# Patient Record
Sex: Female | Born: 1990 | Race: Black or African American | Hispanic: No | Marital: Single | Smoking: Never smoker
Health system: Southern US, Community
[De-identification: ages and names within clinical notes are randomized; demographics above are authoritative.]

## PROBLEM LIST (undated history)

## (undated) DIAGNOSIS — A159 Respiratory tuberculosis unspecified: Secondary | ICD-10-CM

## (undated) DIAGNOSIS — Z87440 Personal history of urinary (tract) infections: Secondary | ICD-10-CM

## (undated) DIAGNOSIS — IMO0002 Reserved for concepts with insufficient information to code with codable children: Secondary | ICD-10-CM

## (undated) DIAGNOSIS — Z789 Other specified health status: Secondary | ICD-10-CM

## (undated) HISTORY — PX: THERAPEUTIC ABORTION: SHX798

## (undated) HISTORY — DX: Respiratory tuberculosis unspecified: A15.9

## (undated) HISTORY — DX: Personal history of urinary (tract) infections: Z87.440

## (undated) HISTORY — DX: Reserved for concepts with insufficient information to code with codable children: IMO0002

---
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
## 2011-05-08 ENCOUNTER — Encounter (HOSPITAL_COMMUNITY): Payer: Self-pay

## 2011-05-08 ENCOUNTER — Inpatient Hospital Stay (HOSPITAL_COMMUNITY)
Admission: AD | Admit: 2011-05-08 | Discharge: 2011-05-11 | DRG: 775 | Disposition: A | Discharge status: Home / Self Care | Payer: Medicaid Other | Source: Ambulatory Visit | Attending: Obstetrics and Gynecology | Admitting: Obstetrics and Gynecology

## 2011-05-08 ENCOUNTER — Inpatient Hospital Stay (HOSPITAL_COMMUNITY): Payer: Medicaid Other

## 2011-05-08 DIAGNOSIS — Z22330 Carrier of Group B streptococcus: Secondary | ICD-10-CM

## 2011-05-08 DIAGNOSIS — O34219 Maternal care for unspecified type scar from previous cesarean delivery: Secondary | ICD-10-CM | POA: Diagnosis present

## 2011-05-08 DIAGNOSIS — Z331 Pregnant state, incidental: Secondary | ICD-10-CM | POA: Diagnosis not present

## 2011-05-08 DIAGNOSIS — IMO0002 Reserved for concepts with insufficient information to code with codable children: Secondary | ICD-10-CM | POA: Diagnosis present

## 2011-05-08 DIAGNOSIS — O0930 Supervision of pregnancy with insufficient antenatal care, unspecified trimester: Secondary | ICD-10-CM

## 2011-05-08 DIAGNOSIS — O4290 Premature rupture of membranes, unspecified as to length of time between rupture and onset of labor, unspecified weeks of gestation: Secondary | ICD-10-CM | POA: Diagnosis present

## 2011-05-08 DIAGNOSIS — O99892 Other specified diseases and conditions complicating childbirth: Secondary | ICD-10-CM | POA: Diagnosis present

## 2011-05-08 HISTORY — DX: Other specified health status: Z78.9

## 2011-05-08 LAB — CBC
Hemoglobin: 10.7 g/dL — ABNORMAL LOW (ref 12.0–15.0)
Platelets: 163 10*3/uL (ref 150–400)
RBC: 3.65 MIL/uL — ABNORMAL LOW (ref 3.87–5.11)
WBC: 9 10*3/uL (ref 4.0–10.5)

## 2011-05-08 LAB — RPR: RPR Ser Ql: NONREACTIVE

## 2011-05-08 MED ORDER — LACTATED RINGERS IV SOLN: 500.0000 mL | INTRAVENOUS | Status: DC | PRN

## 2011-05-08 MED ORDER — PHENYLEPHRINE 40 MCG/ML (10ML) SYRINGE FOR IV PUSH (FOR BLOOD PRESSURE SUPPORT)
80.0000 ug | PREFILLED_SYRINGE | INTRAVENOUS | Status: DC | PRN
  Filled 2011-05-08: qty 5

## 2011-05-08 MED ORDER — PHENYLEPHRINE 40 MCG/ML (10ML) SYRINGE FOR IV PUSH (FOR BLOOD PRESSURE SUPPORT)
80.0000 ug | PREFILLED_SYRINGE | INTRAVENOUS | Status: DC | PRN
  Filled 2011-05-08 (×3): qty 5

## 2011-05-08 MED ORDER — EPHEDRINE 5 MG/ML INJ
10.0000 mg | INTRAVENOUS | Status: DC | PRN
  Filled 2011-05-08 (×3): qty 4

## 2011-05-08 MED ORDER — IBUPROFEN 600 MG PO TABS
600.0000 mg | ORAL_TABLET | Freq: Four times a day (QID) | ORAL | Status: DC | PRN
  Administered 2011-05-09: 600 mg via ORAL
  Filled 2011-05-08: qty 1

## 2011-05-08 MED ORDER — LIDOCAINE HCL (PF) 1 % IJ SOLN
30.0000 mL | INTRAMUSCULAR | Status: DC | PRN
  Filled 2011-05-08 (×2): qty 30

## 2011-05-08 MED ORDER — FLEET ENEMA 7-19 GM/118ML RE ENEM: 1.0000 | ENEMA | RECTAL | Status: DC | PRN

## 2011-05-08 MED ORDER — ACETAMINOPHEN 325 MG PO TABS: 650.0000 mg | ORAL_TABLET | ORAL | Status: DC | PRN

## 2011-05-08 MED ORDER — LACTATED RINGERS IV SOLN
500.0000 mL | Freq: Once | INTRAVENOUS | Status: AC
  Administered 2011-05-09: 1000 mL via INTRAVENOUS

## 2011-05-08 MED ORDER — TERBUTALINE SULFATE 1 MG/ML IJ SOLN: 0.2500 mg | Freq: Once | INTRAMUSCULAR | Status: AC | PRN

## 2011-05-08 MED ORDER — ZOLPIDEM TARTRATE 10 MG PO TABS
10.0000 mg | ORAL_TABLET | Freq: Every evening | ORAL | Status: DC | PRN
  Administered 2011-05-09: 10 mg via ORAL
  Filled 2011-05-08: qty 1

## 2011-05-08 MED ORDER — OXYCODONE-ACETAMINOPHEN 5-325 MG PO TABS
2.0000 | ORAL_TABLET | ORAL | Status: DC | PRN
  Filled 2011-05-08: qty 1

## 2011-05-08 MED ORDER — FENTANYL CITRATE 0.05 MG/ML IJ SOLN: 100.0000 ug | INTRAMUSCULAR | Status: DC | PRN

## 2011-05-08 MED ORDER — FENTANYL 2.5 MCG/ML BUPIVACAINE 1/10 % EPIDURAL INFUSION (WH - ANES)
14.0000 mL/h | INTRAMUSCULAR | Status: DC
  Filled 2011-05-08 (×2): qty 60

## 2011-05-08 MED ORDER — OXYTOCIN 20 UNITS IN LACTATED RINGERS INFUSION - SIMPLE: 125.0000 mL/h | INTRAVENOUS | Status: AC

## 2011-05-08 MED ORDER — ZOLPIDEM TARTRATE 10 MG PO TABS: 10.0000 mg | ORAL_TABLET | Freq: Every evening | ORAL | Status: DC | PRN

## 2011-05-08 MED ORDER — OXYTOCIN 20 UNITS IN LACTATED RINGERS INFUSION - SIMPLE
1.0000 m[IU]/min | INTRAVENOUS | Status: DC
  Administered 2011-05-08: 4 m[IU]/min via INTRAVENOUS
  Administered 2011-05-08: 1 m[IU]/min via INTRAVENOUS

## 2011-05-08 MED ORDER — PENICILLIN G POTASSIUM 5000000 UNITS IJ SOLR
2.5000 10*6.[IU] | INTRAVENOUS | Status: DC
  Administered 2011-05-08 – 2011-05-09 (×2): 2.5 10*6.[IU] via INTRAVENOUS
  Filled 2011-05-08 (×5): qty 2.5

## 2011-05-08 MED ORDER — NALBUPHINE SYRINGE 5 MG/0.5 ML
5.0000 mg | INJECTION | INTRAMUSCULAR | Status: DC | PRN
  Filled 2011-05-08 (×2): qty 0.5

## 2011-05-08 MED ORDER — OXYTOCIN BOLUS FROM INFUSION
500.0000 mL | Freq: Once | INTRAVENOUS | Status: DC
  Administered 2011-05-09: 500 mL via INTRAVENOUS
  Filled 2011-05-08: qty 500
  Filled 2011-05-08: qty 1000

## 2011-05-08 MED ORDER — EPHEDRINE 5 MG/ML INJ
10.0000 mg | INTRAVENOUS | Status: DC | PRN
Start: 2011-05-08 — End: 2011-05-09
  Filled 2011-05-08: qty 4

## 2011-05-08 MED ORDER — DEXTROSE 5 % IV SOLN
5.0000 10*6.[IU] | Freq: Once | INTRAVENOUS | Status: DC
  Administered 2011-05-08: 5 10*6.[IU] via INTRAVENOUS
  Filled 2011-05-08: qty 5

## 2011-05-08 MED ORDER — CITRIC ACID-SODIUM CITRATE 334-500 MG/5ML PO SOLN: 30.0000 mL | ORAL | Status: DC | PRN

## 2011-05-08 MED ORDER — DIPHENHYDRAMINE HCL 50 MG/ML IJ SOLN: 12.5000 mg | INTRAMUSCULAR | Status: DC | PRN

## 2011-05-08 MED ORDER — ONDANSETRON HCL 4 MG/2ML IJ SOLN: 4.0000 mg | Freq: Four times a day (QID) | INTRAMUSCULAR | Status: DC | PRN

## 2011-05-08 MED ORDER — LACTATED RINGERS IV SOLN
INTRAVENOUS | Status: DC
  Administered 2011-05-08 (×2): via INTRAVENOUS

## 2011-05-08 NOTE — Plan of Care (Signed)
Problem: Consults Goal: Birthing Suites Patient Information Press F2 to bring up selections list   Pt > [redacted] weeks EGA     

## 2011-05-08 NOTE — Plan of Care (Signed)
Problem: Consults Goal: Birthing Suites Patient Information Press F2 to bring up selections list Outcome: Completed/Met Date Met:  05/08/11  Pt > [redacted] weeks EGA     

## 2011-05-08 NOTE — Progress Notes (Signed)
Pt sent to MAU for NST and ultrasound. Pt had a NR NST 8-14 in the office and was told to come to MAU but did not. Patients last visit before 8-14 was in April. Pt is a previous cesarean section who desires a TOLAC.

## 2011-05-08 NOTE — Progress Notes (Signed)
Pt sent over for BPP and NST, was seen in office yesterday for  First time since April, had a NR NST and told to come over, didn't come.  Denies any bleeding or leaking of fluid.  + FM.
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
## 2011-05-08 NOTE — Progress Notes (Signed)
05/08/11 2050  Provider Notification  Provider Name/Title Steelman, Steele Sizer, CNM  Method of Notification Phone  Provider called to check up on pt. Notified about FHR, UC and pitocin level. Provider stated to leave pitocin at 32mu/hr.
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
## 2011-05-08 NOTE — Progress Notes (Signed)
Subjective: Pt just up to br.  No c/o's.  Discussed with patient earlier this evening that amnisure positive and has noted intermittent water leakage since yesterday, but unsure exact time first noticed; afi nml on u/s today.  3 males and 1 female visitor at bs currently.   Objective: BP 113/62  Pulse 75  Temp(Src) 98.1 F (36.7 C) (Oral)  Resp 18  Ht 4\' 9"  (1.448 m)  Wt 54.795 kg (120 lb 12.8 oz)  BMI 26.14 kg/m2  SpO2 98%      FHT:  FHR: 135 bpm, variability: moderate,  accelerations:  Abscent,  decelerations:  Present occ'l early, occ'l variable with late component; 10x10 accels, but not 15x15 UC:   irregular, every 2-6 minutes SVE:   Dilation: 2.5 Effacement (%): 60 Station: -2 Exam by:: Manfred Arch, CNM  Labs: Lab Results  Component Value Date   WBC 9.0 05/08/2011   HGB 10.7* 05/08/2011   HCT 32.0* 05/08/2011   MCV 87.7 05/08/2011   PLT 163 05/08/2011    Assessment / Plan: Induction of labor due to PROM,  progressing well on pitocin No prenatal care since April until yesterday.  Labor: Ripening tonight on maintenance of 25mu/min of Pitocin (per c/w dr. Normand Sloop) Preeclampsia:  n/a Fetal Wellbeing:  reassuring, but nonreactive Pain Control:  Labor support without medications I/D:  n/a Anticipated MOD:  TOLAC Recommended ambien to assist with sleep tonight; encouraged patient to try & rest; 4 visitors at bs currently. C/w MD prn  Sydney Azure H 05/08/2011, 11:50 PM

## 2011-05-09 ENCOUNTER — Encounter (HOSPITAL_COMMUNITY): Payer: Self-pay | Admitting: Anesthesiology

## 2011-05-09 ENCOUNTER — Inpatient Hospital Stay (HOSPITAL_COMMUNITY): Payer: Medicaid Other | Admitting: Anesthesiology

## 2011-05-09 ENCOUNTER — Encounter (HOSPITAL_COMMUNITY): Payer: Self-pay | Admitting: *Deleted

## 2011-05-09 ENCOUNTER — Other Ambulatory Visit: Payer: Self-pay

## 2011-05-09 DIAGNOSIS — Z331 Pregnant state, incidental: Secondary | ICD-10-CM | POA: Diagnosis not present

## 2011-05-09 DIAGNOSIS — IMO0002 Reserved for concepts with insufficient information to code with codable children: Secondary | ICD-10-CM | POA: Diagnosis present

## 2011-05-09 DIAGNOSIS — O4290 Premature rupture of membranes, unspecified as to length of time between rupture and onset of labor, unspecified weeks of gestation: Secondary | ICD-10-CM | POA: Diagnosis present

## 2011-05-09 MED ORDER — OXYCODONE-ACETAMINOPHEN 5-325 MG PO TABS
1.0000 | ORAL_TABLET | ORAL | Status: DC | PRN
  Administered 2011-05-09 – 2011-05-11 (×4): 1 via ORAL
  Administered 2011-05-11 (×3): 2 via ORAL
  Filled 2011-05-09: qty 2
  Filled 2011-05-09 (×2): qty 1
  Filled 2011-05-09 (×2): qty 2
  Filled 2011-05-09: qty 1

## 2011-05-09 MED ORDER — DIBUCAINE 1 % RE OINT: 1.0000 "application " | TOPICAL_OINTMENT | RECTAL | Status: DC | PRN

## 2011-05-09 MED ORDER — LANOLIN HYDROUS EX OINT: TOPICAL_OINTMENT | CUTANEOUS | Status: DC | PRN

## 2011-05-09 MED ORDER — IBUPROFEN 600 MG PO TABS
600.0000 mg | ORAL_TABLET | Freq: Four times a day (QID) | ORAL | Status: DC
  Administered 2011-05-09 – 2011-05-11 (×9): 600 mg via ORAL
  Filled 2011-05-09 (×9): qty 1

## 2011-05-09 MED ORDER — ZOLPIDEM TARTRATE 5 MG PO TABS: 5.0000 mg | ORAL_TABLET | Freq: Every evening | ORAL | Status: DC | PRN

## 2011-05-09 MED ORDER — BENZOCAINE-MENTHOL 20-0.5 % EX AERO
1.0000 "application " | INHALATION_SPRAY | CUTANEOUS | Status: DC | PRN
  Administered 2011-05-09: 1 via TOPICAL

## 2011-05-09 MED ORDER — MAGNESIUM HYDROXIDE 400 MG/5ML PO SUSP: 30.0000 mL | ORAL | Status: DC | PRN

## 2011-05-09 MED ORDER — MEASLES, MUMPS & RUBELLA VAC ~~LOC~~ INJ
0.5000 mL | INJECTION | Freq: Once | SUBCUTANEOUS | Status: DC
  Filled 2011-05-09: qty 0.5

## 2011-05-09 MED ORDER — SENNOSIDES-DOCUSATE SODIUM 8.6-50 MG PO TABS
2.0000 | ORAL_TABLET | Freq: Every day | ORAL | Status: DC
  Administered 2011-05-09 – 2011-05-10 (×2): 2 via ORAL

## 2011-05-09 MED ORDER — DIPHENHYDRAMINE HCL 25 MG PO CAPS: 25.0000 mg | ORAL_CAPSULE | Freq: Four times a day (QID) | ORAL | Status: DC | PRN

## 2011-05-09 MED ORDER — WITCH HAZEL-GLYCERIN EX PADS
1.0000 "application " | MEDICATED_PAD | CUTANEOUS | Status: DC | PRN
  Administered 2011-05-09: 1 via TOPICAL

## 2011-05-09 MED ORDER — SIMETHICONE 80 MG PO CHEW: 80.0000 mg | CHEWABLE_TABLET | ORAL | Status: DC | PRN

## 2011-05-09 MED ORDER — ONDANSETRON HCL 4 MG/2ML IJ SOLN: 4.0000 mg | INTRAMUSCULAR | Status: DC | PRN

## 2011-05-09 MED ORDER — BENZOCAINE-MENTHOL 20-0.5 % EX AERO
INHALATION_SPRAY | CUTANEOUS | Status: AC
  Administered 2011-05-09: 1 via TOPICAL
  Filled 2011-05-09: qty 56

## 2011-05-09 MED ORDER — ONDANSETRON HCL 4 MG PO TABS: 4.0000 mg | ORAL_TABLET | ORAL | Status: DC | PRN

## 2011-05-09 MED ORDER — MEDROXYPROGESTERONE ACETATE 150 MG/ML IM SUSP: 150.0000 mg | INTRAMUSCULAR | Status: DC | PRN

## 2011-05-09 MED ORDER — TETANUS-DIPHTH-ACELL PERTUSSIS 5-2.5-18.5 LF-MCG/0.5 IM SUSP
0.5000 mL | Freq: Once | INTRAMUSCULAR | Status: AC
  Administered 2011-05-10: 0.5 mL via INTRAMUSCULAR
  Filled 2011-05-09: qty 0.5

## 2011-05-09 MED ORDER — PRENATAL PLUS 27-1 MG PO TABS
1.0000 | ORAL_TABLET | Freq: Every day | ORAL | Status: DC
  Administered 2011-05-09 – 2011-05-11 (×3): 1 via ORAL
  Filled 2011-05-09 (×3): qty 1

## 2011-05-09 MED ORDER — PRENATAL PLUS 27-1 MG PO TABS: 1.0000 | ORAL_TABLET | Freq: Every day | ORAL | Status: DC

## 2011-05-09 NOTE — Progress Notes (Signed)
Pt called out, thinks she ROM

## 2011-05-09 NOTE — Anesthesia Preprocedure Evaluation (Signed)
Anesthesia Evaluation  Name, MR# and DOB Patient awake  General Assessment Comment  Reviewed: Allergy & Precautions, H&P , Patient's Chart, lab work & pertinent test results  Airway Mallampati: II TM Distance: >3 FB Neck ROM: full    Dental  (+) Teeth Intact   Pulmonary  clear to auscultation  breath sounds clear to auscultation none    Cardiovascular regular Normal    Neuro/Psych   GI/Hepatic/Renal   Endo/Other    Abdominal   Musculoskeletal   Hematology   Peds  Reproductive/Obstetrics (+) Pregnancy    Anesthesia Other Findings VBAC            Anesthesia Physical Anesthesia Plan  ASA: II  Anesthesia Plan: Epidural   Post-op Pain Management:    Induction:   Airway Management Planned:   Additional Equipment:   Intra-op Plan:   Post-operative Plan:   Informed Consent: I have reviewed the patients History and Physical, chart, labs and discussed the procedure including the risks, benefits and alternatives for the proposed anesthesia with the patient or authorized representative who has indicated his/her understanding and acceptance.   Dental Advisory Given  Plan Discussed with: CRNA and Surgeon  Anesthesia Plan Comments: (Labs checked- platelets confirmed with RN in room. Fetal heart tracing, per RN, reportedly stable enough for sitting procedure. Discussed epidural, and patient consents to the procedure:  included risk of possible headache,backache, failed block, allergic reaction, and nerve injury. This patient was asked if she had any questions or concerns before the procedure started. )        Anesthesia Quick Evaluation

## 2011-05-09 NOTE — Progress Notes (Signed)
Subjective: Pt requesting pain meds & c/o vag pressure.  RN checked pt's cx and 7cm.  Pt very droggy s/p Ambien and FHR with minimal variability, so rec'd epidural over IV pain med.  Pt able to sleep in btwn ctxs.  Female visitor remains at bs at present.  Objective: BP 111/77  Pulse 79  Temp(Src) 97.7 F (36.5 C) (Oral)  Resp 18  Ht 4\' 9"  (1.448 m)  Wt 54.795 kg (120 lb 12.8 oz)  BMI 26.14 kg/m2  SpO2 98%      FHT:  FHR: 130 bpm, variability: minimal ,  accelerations:  Abscent,  decelerations:  Absent UC:   regular, every 2 minutes on 4mu Pitocin SVE:   Dilation: 7.5 Effacement (%): 80 Station: -2 Exam by:: Ansah-mensah, rnc RN reports BBOW Labs: Lab Results  Component Value Date   WBC 9.0 05/08/2011   HGB 10.7* 05/08/2011   HCT 32.0* 05/08/2011   MCV 87.7 05/08/2011   PLT 163 05/08/2011    Assessment / Plan: Laboring off 4mu Pitocin without titration  Labor: Progressing on Pitocin, will continue to increase then AROM Preeclampsia:  n/a Fetal Wellbeing:  Category I Pain Control:  Will proceed with epidural I/D:  n/a Anticipated MOD:  NSVD  [place] Basham H 05/09/2011, 3:03 AM

## 2011-05-09 NOTE — Progress Notes (Signed)
05/09/11 0240  Provider Notification  Provider Name/Title Antonietta Breach, CNM  Method of Notification Phone  Notified about FHR, UC, pt 's request for pain medication

## 2011-05-09 NOTE — Progress Notes (Signed)
Dr. Jean Rosenthal in patient's room around 0315, and because patient drowsy from Ambien, he would not put in epidural.  Just talked with him again and explained that patient immature, from [place], and demeanor also part of her personality.  Asked if he would come back and try again now because she is more alert at this time, and he agreed to come back to bedside at 5:15.

## 2011-05-09 NOTE — Progress Notes (Signed)
05/09/11 0314  Epidural  Epidural   ANMD @ BS   Dr. Sherron Ales at bedside to place epidural. Explained risk and benefit to epidural to pt. MD not comfortable placing  Epidural because pt is sleepy and cannot verbalize back risk and benefit of epidural

## 2011-05-09 NOTE — Progress Notes (Signed)
Delivery Note At 5:05 AM a viable female "[name]" was delivered via Vaginal, Spontaneous Delivery (Presentation:LOA ;  ).  APGAR: 6, 9; weight 7 lb 1 oz (3204 g).   Placenta status: Intact, Spontaneous.  Cord: 3 vessels with the following complications: None.    Anesthesia: None  Episiotomy: None Lacerations:bilateral Labial; hemostatic; Left lac deeper than Rt Suture Repair: no repair secondary to hemostatic Est. Blood Loss (mL): 200  Mom to postpartum.  Baby to nursery-stable. Plans to BF & Desires Circumcision (OP secondary to Medicaid) Gabriela Benton H 05/09/2011, 5:47 AM

## 2011-05-09 NOTE — Progress Notes (Signed)
Addendum to previous note: Informed patient of Dr. Edison Pace plan.  Pt very uncomfortable and wanting pain intervention. Discussed with patient I do not feel comfortable giving her Nubain at this time secondary to [facility] and risk of making her more groggy in anticipation of getting epidural and need to be more alert. Ordered RN to decrease Pitocin to 67mu/min from 4mu, to try & space ctxs in meantime for comfort.

## 2011-05-09 NOTE — Progress Notes (Signed)
UR Chart review completed.  

## 2011-05-09 NOTE — Progress Notes (Signed)
05/09/11 0329  Provider Notification  Provider Name/Title Antonietta Breach, CNM  Method of Notification Phone  Informed that pt was not able to get epidural and is requesting for pain medication. Will call and talk to anesthesiologist

## 2011-05-09 NOTE — Progress Notes (Signed)
Called to bs just before 5am because patient complete and involuntarily pushing.  Pt's labor continued to progress on 2mu of Pitocin and was unable to receive epidural, and withheld IV pain medicine secondary to attempting to get epidural.  On arrival fetal station +3 to +4 and starting to crown.

## 2011-05-10 LAB — CBC
HCT: 28.6 % — ABNORMAL LOW (ref 36.0–46.0)
MCHC: 34.6 g/dL (ref 30.0–36.0)
MCV: 86.9 fL (ref 78.0–100.0)
RDW: 16.7 % — ABNORMAL HIGH (ref 11.5–15.5)
WBC: 12 10*3/uL — ABNORMAL HIGH (ref 4.0–10.5)

## 2011-05-10 NOTE — Progress Notes (Signed)
Post Partum Day 1 Subjective: no complaints, up ad lib, voiding and tolerating PO  Objective: Blood pressure 100/62, pulse 67, temperature 98 F (36.7 C), temperature source Oral, resp. rate 18, height 4\' 9"  (1.448 m), weight 54.795 kg (120 lb 12.8 oz), SpO2 100.00%, unknown if currently breastfeeding.  Physical Exam:  General: alert, cooperative and no distress Lochia: appropriate Uterine Fundus: firm Incision: healing well DVT Evaluation: No evidence of DVT seen on physical exam.   Basename 05/10/11 0520 05/08/11 1548  HGB 9.9* 10.7*  HCT 28.6* 32.0*    Assessment/Plan: Plan for discharge tomorrow, Discharge home, Lactation consult, Social Work consult and Contraception Depo-Provera   LOS: 2 days   Anabel Halon 05/10/2011, 9:06 AM

## 2011-05-11 MED ORDER — MEDROXYPROGESTERONE ACETATE 150 MG/ML IM SUSP: 150.0000 mg | Freq: Once | INTRAMUSCULAR | Status: DC

## 2011-05-11 MED ORDER — MEDROXYPROGESTERONE ACETATE 150 MG/ML IM SUSP: 150.0000 mg | INTRAMUSCULAR | Status: DC

## 2011-05-11 NOTE — Progress Notes (Signed)
Post Partum Day 2 Subjective: no complaints, up ad lib, voiding, tolerating PO and Desires D/C home  Objective: Blood pressure 119/83, pulse 69, temperature 98.2 F (36.8 C), temperature source Oral, resp. rate 18, height 4\' 9"  (1.448 m), weight 54.795 kg (120 lb 12.8 oz), SpO2 100.00%, unknown if currently breastfeeding.  Physical Exam:  General: alert, cooperative and no distress Lochia: appropriate Uterine Fundus: firm Incision: n/a DVT Evaluation: No evidence of DVT seen on physical exam.   Basename 05/10/11 0520 05/08/11 1548  HGB 9.9* 10.7*  HCT 28.6* 32.0*    Assessment/Plan: Discharge home, Breastfeeding, Social Work consult and Contraception Depo-provera. Plans Outpatient Circ   LOS: 3 days   Gabriela Benton 05/11/2011, 8:07 AM

## 2011-05-11 NOTE — Discharge Summary (Signed)
   Obstetric Discharge Summary Reason for Admission: induction of labor and for insufficient prenatal care and non-compliance Prenatal Procedures: NST and ultrasound Intrapartum Procedures: spontaneous vaginal delivery/ Successful VBAC Postpartum Procedures: none Complications-Operative and Postpartum: none  Temp:  [97.9 F (36.6 C)-98.2 F (36.8 C)] 98.2 F (36.8 C) (08/18 0535) Pulse Rate:  [66-69] 69  (08/18 0535) Resp:  [16-18] 18  (08/18 0535) BP: (94-119)/(55-83) 119/83 mmHg (08/18 0535) Hemoglobin  Date Value Range Status  05/10/2011 9.9* 12.0-15.0 (g/dL) Final     HCT  Date Value Range Status  05/10/2011 28.6* 36.0-46.0 (%) Final    Hospital Course:  Pt presented to L&D >24 hours after instructions to go for Non-Reactive NST in office. Pt had lapse in care of 17 wks, with inability of office to contact patient and multiple letters sent without response. Pt was seen in office @ 40.[redacted] wks EGA, had Non-Reactive NST. Admitted following day for IOL. Progressed rapidly without epidural and successfully VBAC a LMI, Intact perineum. PP course unremarkable, with social work consult for insufficient [facility], age, pt currently in foster care, and language barrier. On PP day 2 pt. Requests d/c home. Several family members @ BS for support. Plans for DMPA prior to d/c.   Discharge Diagnoses: Term Pregnancy-delivered and Successful VBAC  Discharge Information: Date: 05/11/2011 Activity: pelvic rest Diet: routine Medications: Motrin 600 mg po q 6 hrs prn pain Depo-Provera prior to d/c Medication List  As of 05/11/2011  8:09 AM   START taking these medications         medroxyPROGESTERone 150 MG/ML injection   Commonly known as: DEPO-PROVERA   Inject 1 mL (150 mg total) into the muscle every 3 (three) months.         CONTINUE taking these medications         prenatal vitamin w/FE, FA 27-1 MG Tabs          Where to get your medications    These are the prescriptions that you need to  pick up.   You may get these medications from any pharmacy.         medroxyPROGESTERone 150 MG/ML injection           Condition: stable Instructions: refer to practice specific booklet Discharge to: home Follow-up Information    Follow up with [facility]. Make an appointment in 6 weeks. (or  as needed if symptoms worsen)          Newborn Data: Live born  Information for the patient's newborn:  Jaquayla, Hege [[id number]]  female ; APGAR , ; weight ;  Home with mother.  Anabel Halon 05/11/2011, 8:09 AM

## 2011-09-24 NOTE — L&D Delivery Note (Signed)
Delivery Note  I was called to pt's BS secondary to increase in ctx, pt requesting pain meds. She was 9cm w BBOW and involuntarily pushing, she was quickly tx'd to birthing suites and NICU team called, Dr Pennie Rushing also updated AROM for light stained meconium, just prior to delivery, pt continued pushing well   At 9:38 AM a viable female was delivered via VBAC, Spontaneous (Presentation: Right Occiput Anterior).  Infant was vigorous w spontaneous cry, cord cut and clamped and infant to warmer and NICU team present, APGAR: 9, 9; weight 3 lb 15.1 oz (1788 g).   Placenta status: spontaneous, intact, approx 4cm area of abruption on lateral edge, sent to pathology  Cord: 3 vessels with the following complications: .  Cord pH: n/a  Anesthesia: None  Episiotomy: None Lacerations: None Suture Repair: n/a Est. Blood Loss (mL): 100  Mom to postpartum.  Baby to NICU. Pt plans to BF Dr Pennie Rushing notified   Sanda Klein M 05/02/2012, 10:02 AM

## 2012-01-29 ENCOUNTER — Emergency Department (HOSPITAL_COMMUNITY)
Admission: EM | Admit: 2012-01-29 | Discharge: 2012-01-30 | Disposition: A | Discharge status: Home / Self Care | Payer: Medicaid Other | Attending: Emergency Medicine | Admitting: Emergency Medicine

## 2012-01-29 ENCOUNTER — Encounter (HOSPITAL_COMMUNITY): Payer: Self-pay | Admitting: *Deleted

## 2012-01-29 DIAGNOSIS — O209 Hemorrhage in early pregnancy, unspecified: Secondary | ICD-10-CM | POA: Insufficient documentation

## 2012-01-29 DIAGNOSIS — Z3490 Encounter for supervision of normal pregnancy, unspecified, unspecified trimester: Secondary | ICD-10-CM

## 2012-01-29 NOTE — ED Notes (Signed)
Pt experienced cramping and vaginal bleeding yesterday.  Today bleeding continues, but no cramping.  Bleeding diminished today.  LMP 08/13/11

## 2012-01-30 ENCOUNTER — Emergency Department (HOSPITAL_COMMUNITY): Payer: Medicaid Other

## 2012-01-30 LAB — BASIC METABOLIC PANEL
BUN: 5 mg/dL — ABNORMAL LOW (ref 6–23)
Calcium: 8.8 mg/dL (ref 8.4–10.5)
Creatinine, Ser: 0.63 mg/dL (ref 0.50–1.10)
GFR calc Af Amer: >90 mL/min (ref >90)
GFR calc non Af Amer: >90 mL/min (ref >90)
Potassium: 3.7 mEq/L (ref 3.5–5.1)

## 2012-01-30 LAB — URINALYSIS, ROUTINE W REFLEX MICROSCOPIC
Bilirubin Urine: NEGATIVE
Ketones, ur: NEGATIVE mg/dL
Nitrite: NEGATIVE
Protein, ur: NEGATIVE mg/dL
Urobilinogen, UA: 1 mg/dL (ref 0.0–1.0)

## 2012-01-30 LAB — POCT PREGNANCY, URINE: Preg Test, Ur: POSITIVE — AB

## 2012-01-30 LAB — GC/CHLAMYDIA PROBE AMP, GENITAL: Chlamydia, DNA Probe: NEGATIVE

## 2012-01-30 LAB — CBC
Hemoglobin: 11.3 g/dL — ABNORMAL LOW (ref 12.0–15.0)
MCH: 31.4 pg (ref 26.0–34.0)
MCHC: 36 g/dL (ref 30.0–36.0)

## 2012-01-30 LAB — DIFFERENTIAL
Basophils Relative: 0 % (ref 0–1)
Eosinophils Absolute: 0 10*3/uL (ref 0.0–0.7)
Monocytes Relative: 9 % (ref 3–12)
Neutrophils Relative %: 61 % (ref 43–77)

## 2012-01-30 LAB — URINE MICROSCOPIC-ADD ON

## 2012-01-30 MED ORDER — NITROFURANTOIN MONOHYD MACRO 100 MG PO CAPS: 100.0000 mg | ORAL_CAPSULE | Freq: Two times a day (BID) | ORAL | Status: AC

## 2012-01-30 MED ORDER — PRENATAL COMPLETE 14-0.4 MG PO TABS: 1.0000 | ORAL_TABLET | Freq: Every day | ORAL | Status: DC

## 2012-01-30 NOTE — ED Notes (Signed)
Pt back from CT

## 2012-01-30 NOTE — ED Notes (Signed)
Delay explained to patient -- ultrasound tech is currently en route to [facility]

## 2012-01-30 NOTE — ED Notes (Signed)
Ultrasound technician is off campus at [facility]. Currently waiting for tech to return to [facility] campus to complete ordered ultrasound.

## 2012-01-30 NOTE — ED Provider Notes (Signed)
Medical screening examination/treatment/procedure(s) were conducted as a shared visit with non-physician practitioner(s) and myself.  I personally evaluated the patient during the encounter. Pregnancy with possible vaginal bleeding. No vaginal bleeding on pelvic exam. Ultrasound was done and showed an intrauterine pregnancy. She was instructed to followup with OB.  Juliet Rude. Rubin Payor, MD 01/30/12 0730

## 2012-01-30 NOTE — ED Provider Notes (Signed)
History     CSN: [id number]  Arrival date & time 01/29/12  2254   First MD Initiated Contact with Patient 01/29/12 2331      Chief Complaint  Patient presents with  . Vaginal Bleeding    LMP 08/12/12    (Consider location/radiation/quality/duration/timing/severity/associated sxs/prior treatment) HPI Comments: 21 yo G3P2 female presents today with vaginal bleeding.  Patient was last in labor in August.  She believes she is currently pregnant with her lmp in November 2012.  Reports minimal vaginal bleeding beginning yesterday.  Blood is found on her bathroom tissue, but not in her underwear.  Describes it as bright red blood with no clots.  She is not on any form of birth control and she is sexually active.  Has not confirmed her pregnancy or been seen by an obstetrician to seek prenatal care.  Last birth was a c-section at 40.5 weeks.  Both of her children are healthy with no complications throughout her pregnancies or during labor.  Patient reports fetal movement as recent as last evening, breast tenderness.  Patient reports increased vaginal discharge with a white color.  Reports vaginal itching, fever.  Denies abdominal/pelvic/back pain, genital sores, rash, headaches, dysuria, polyuria, urgency. Nothing makes bleeding better or worse. Bleeding is improving today.   Patient is a 21 y.o. female presenting with vaginal bleeding. The history is provided by the patient.  Vaginal Bleeding This is a new problem. The current episode started yesterday. The problem occurs constantly. The problem has been gradually improving. Pertinent negatives include no abdominal pain, chest pain, chills, coughing, fever, headaches, myalgias, nausea, rash, sore throat or vomiting.    Past Medical History  Diagnosis Date  . No pertinent past medical history     Past Surgical History  Procedure Date  . Cesarean section   . Cesarian 2011    No family history on file.  History  Substance Use Topics  .  Smoking status: Never Smoker   . Smokeless tobacco: Never Used  . Alcohol Use: No    OB History    Grav Para Term Preterm Abortions TAB SAB Ect Mult Living   2 2 2  0 0 0 0 0 0 2      Review of Systems  Constitutional: Negative for fever and chills.  HENT: Negative for sore throat and rhinorrhea.   Eyes: Negative for redness.  Respiratory: Negative for cough and shortness of breath.   Cardiovascular: Negative for chest pain.  Gastrointestinal: Negative for nausea, vomiting, abdominal pain, diarrhea and blood in stool.  Genitourinary: Positive for vaginal bleeding and vaginal discharge. Negative for dysuria, urgency, frequency, hematuria, flank pain, decreased urine volume, genital sores, vaginal pain, pelvic pain and dyspareunia.  Musculoskeletal: Negative for myalgias.  Skin: Negative for rash.  Neurological: Negative for headaches.    Allergies  Review of patient's allergies indicates no known allergies.  Home Medications  No current outpatient prescriptions on file.  BP 114/74  Pulse 85  Temp(Src) 98.5 F (36.9 C) (Oral)  Ht 5' (1.524 m)  Wt 103 lb (46.72 kg)  BMI 20.12 kg/m2  SpO2 100%  Breastfeeding? Unknown  Physical Exam  Nursing note and vitals reviewed. Constitutional: She appears well-developed and well-nourished. No distress.  HENT:  Head: Normocephalic and atraumatic.  Eyes: Conjunctivae are normal. Right eye exhibits no discharge. Left eye exhibits no discharge.  Neck: Normal range of motion. Neck supple.  Cardiovascular: Normal rate, regular rhythm and normal heart sounds.   No murmur heard. Pulmonary/Chest: Effort normal  and breath sounds normal. No respiratory distress. She has no wheezes.  Abdominal: Soft. Bowel sounds are normal. She exhibits no distension. There is no tenderness. There is no rebound and no guarding.  Genitourinary: Vaginal discharge found.       No erythema or sores on the labia.  Cervix closed with no blood noted in the vaginal  vault or at the cervical os.  Thick white discharge noted to be coming from cervical os. Cervix mobile and soft.  Uterus is gravid to 2 cm above umbilicus. Mild tenderness with palpation of right adnexa without mass. No cervical motion tenderness.   Musculoskeletal: She exhibits no edema and no tenderness.  Neurological: She is alert.  Skin: Skin is warm and dry.  Psychiatric: She has a normal mood and affect.    ED Course  Procedures (including critical care time)  Labs Reviewed  CBC - Abnormal; Notable for the following:    RBC 3.60 (*)    Hemoglobin 11.3 (*)    HCT 31.4 (*)    All other components within normal limits  BASIC METABOLIC PANEL - Abnormal; Notable for the following:    Glucose, Bld 67 (*)    BUN 5 (*)    All other components within normal limits  URINALYSIS, ROUTINE W REFLEX MICROSCOPIC - Abnormal; Notable for the following:    APPearance CLOUDY (*)    Leukocytes, UA SMALL (*)    All other components within normal limits  WET PREP, GENITAL - Abnormal; Notable for the following:    WBC, Wet Prep HPF POC TOO NUMEROUS TO COUNT (*)    All other components within normal limits  POCT PREGNANCY, URINE - Abnormal; Notable for the following:    Preg Test, Ur POSITIVE (*)    All other components within normal limits  URINE MICROSCOPIC-ADD ON - Abnormal; Notable for the following:    Squamous Epithelial / LPF MANY (*)    Bacteria, UA MANY (*)    All other components within normal limits  DIFFERENTIAL  ABO/RH  GC/CHLAMYDIA PROBE AMP, GENITAL   No results found.   No diagnosis found.  12:31 AM Patient seen and examined. Work-up initiated. +preg.   Vital signs reviewed and are as follows: Filed Vitals:   01/29/12 2257  BP: 114/74  Pulse: 85  Temp: 98.5 F (36.9 C)   Pelvic exam performed with chaperone. [place] pending.   1:23 AM Handoff to Dr. Rubin Payor who will follow-up on [place] results.   MDM  Pregnant patient, no pre-natal care. [place] pending to eval pregnancy.  Possible cervicitis.         Renne Crigler, [place] 01/30/12 0127

## 2012-01-30 NOTE — ED Notes (Signed)
Gave pt some water. Okayed by Dr. Rubin Payor.

## 2012-04-21 ENCOUNTER — Ambulatory Visit (INDEPENDENT_AMBULATORY_CARE_PROVIDER_SITE_OTHER): Payer: Medicaid Other | Admitting: Obstetrics and Gynecology

## 2012-04-21 ENCOUNTER — Encounter: Payer: Self-pay | Admitting: Obstetrics and Gynecology

## 2012-04-21 VITALS — BP 90/58 | Wt 123.0 lb

## 2012-04-21 DIAGNOSIS — Z331 Pregnant state, incidental: Secondary | ICD-10-CM

## 2012-04-21 DIAGNOSIS — F1910 Other psychoactive substance abuse, uncomplicated: Secondary | ICD-10-CM

## 2012-04-21 DIAGNOSIS — Z9889 Other specified postprocedural states: Secondary | ICD-10-CM

## 2012-04-21 DIAGNOSIS — Z8742 Personal history of other diseases of the female genital tract: Secondary | ICD-10-CM

## 2012-04-21 DIAGNOSIS — Z98891 History of uterine scar from previous surgery: Secondary | ICD-10-CM

## 2012-04-21 DIAGNOSIS — O0930 Supervision of pregnancy with insufficient antenatal care, unspecified trimester: Secondary | ICD-10-CM

## 2012-04-21 LAB — POCT URINALYSIS DIPSTICK
Bilirubin, UA: NEGATIVE
Glucose, UA: NEGATIVE
Ketones, UA: NEGATIVE
Leukocytes, UA: NEGATIVE

## 2012-04-21 MED ORDER — FUSION PLUS PO CAPS: 1.0000 | ORAL_CAPSULE | Freq: Every day | ORAL | Status: DC

## 2012-04-21 NOTE — Progress Notes (Signed)
Pt unsure LMP

## 2012-04-22 LAB — PRENATAL PANEL VII
Basophils Absolute: 0 10*3/uL (ref 0.0–0.1)
HCT: 31.5 % — ABNORMAL LOW (ref 36.0–46.0)
HIV: NONREACTIVE
Hemoglobin: 11.2 g/dL — ABNORMAL LOW (ref 12.0–15.0)
Hepatitis B Surface Ag: NEGATIVE
Lymphocytes Relative: 26 % (ref 12–46)
Monocytes Absolute: 0.4 10*3/uL (ref 0.1–1.0)
Monocytes Relative: 6 % (ref 3–12)
Neutro Abs: 5.2 10*3/uL (ref 1.7–7.7)
Neutrophils Relative %: 68 % (ref 43–77)
Rh Type: POSITIVE
WBC: 7.6 10*3/uL (ref 4.0–10.5)

## 2012-04-22 LAB — GLUCOSE TOLERANCE, 1 HOUR (50G) W/O FASTING: Glucose, 1 Hour GTT: 106 mg/dL (ref 70–140)

## 2012-04-22 LAB — VARICELLA ZOSTER ANTIBODY, IGG: Varicella IgG: 4.04 {ISR} — ABNORMAL HIGH

## 2012-04-24 NOTE — Addendum Note (Signed)
Addended byLavera Guise on: 04/24/2012 04:48 PM   Modules accepted: Level of Service
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
## 2012-04-26 ENCOUNTER — Encounter (HOSPITAL_COMMUNITY): Payer: Self-pay

## 2012-04-26 ENCOUNTER — Inpatient Hospital Stay (HOSPITAL_COMMUNITY)
Admission: AD | Admit: 2012-04-26 | Discharge: 2012-05-04 | DRG: 774 | Disposition: A | Discharge status: Home / Self Care | Payer: Medicaid Other | Source: Ambulatory Visit | Attending: Obstetrics and Gynecology | Admitting: Obstetrics and Gynecology

## 2012-04-26 ENCOUNTER — Inpatient Hospital Stay (HOSPITAL_COMMUNITY): Payer: Medicaid Other

## 2012-04-26 DIAGNOSIS — O0930 Supervision of pregnancy with insufficient antenatal care, unspecified trimester: Secondary | ICD-10-CM

## 2012-04-26 DIAGNOSIS — O9902 Anemia complicating childbirth: Secondary | ICD-10-CM | POA: Diagnosis present

## 2012-04-26 DIAGNOSIS — O4690 Antepartum hemorrhage, unspecified, unspecified trimester: Secondary | ICD-10-CM

## 2012-04-26 DIAGNOSIS — O365990 Maternal care for other known or suspected poor fetal growth, unspecified trimester, not applicable or unspecified: Secondary | ICD-10-CM | POA: Diagnosis present

## 2012-04-26 DIAGNOSIS — D649 Anemia, unspecified: Secondary | ICD-10-CM | POA: Diagnosis present

## 2012-04-26 DIAGNOSIS — O4590 Premature separation of placenta, unspecified, unspecified trimester: Secondary | ICD-10-CM

## 2012-04-26 DIAGNOSIS — O34219 Maternal care for unspecified type scar from previous cesarean delivery: Secondary | ICD-10-CM | POA: Diagnosis not present

## 2012-04-26 DIAGNOSIS — O4100X0 Oligohydramnios, unspecified trimester, not applicable or unspecified: Secondary | ICD-10-CM | POA: Diagnosis present

## 2012-04-26 DIAGNOSIS — O09899 Supervision of other high risk pregnancies, unspecified trimester: Secondary | ICD-10-CM

## 2012-04-26 LAB — CBC
HCT: 24.3 % — ABNORMAL LOW (ref 36.0–46.0)
Hemoglobin: 8.9 g/dL — ABNORMAL LOW (ref 12.0–15.0)
Hemoglobin: 8.7 g/dL — ABNORMAL LOW (ref 12.0–15.0)
MCH: 31.2 pg (ref 26.0–34.0)
MCH: 31 pg (ref 26.0–34.0)
MCHC: 35.8 g/dL (ref 30.0–36.0)
MCV: 86.5 fL (ref 78.0–100.0)
Platelets: 165 10*3/uL (ref 150–400)
Platelets: 146 K/uL — ABNORMAL LOW (ref 150–400)
RBC: 2.85 MIL/uL — ABNORMAL LOW (ref 3.87–5.11)
RBC: 2.81 MIL/uL — ABNORMAL LOW (ref 3.87–5.11)
RDW: 12.7 % (ref 11.5–15.5)
WBC: 8.5 10*3/uL (ref 4.0–10.5)
WBC: 10.2 K/uL (ref 4.0–10.5)

## 2012-04-26 LAB — PREPARE RBC (CROSSMATCH)

## 2012-04-26 LAB — ABO/RH: ABO/RH(D): O POS

## 2012-04-26 MED ORDER — LACTATED RINGERS IV SOLN
INTRAVENOUS | Status: DC
  Administered 2012-04-26 – 2012-04-29 (×9): via INTRAVENOUS
  Administered 2012-04-30: 125 mL/h via INTRAVENOUS
  Administered 2012-04-30: 01:00:00 via INTRAVENOUS

## 2012-04-26 MED ORDER — CALCIUM CARBONATE ANTACID 500 MG PO CHEW: 2.0000 | CHEWABLE_TABLET | ORAL | Status: DC | PRN

## 2012-04-26 MED ORDER — PRENATAL MULTIVITAMIN CH
1.0000 | ORAL_TABLET | Freq: Every day | ORAL | Status: DC
  Administered 2012-04-27 – 2012-05-01 (×5): 1 via ORAL
  Filled 2012-04-26 (×6): qty 1

## 2012-04-26 MED ORDER — BETAMETHASONE SOD PHOS & ACET 6 (3-3) MG/ML IJ SUSP
12.0000 mg | Freq: Once | INTRAMUSCULAR | Status: AC
  Administered 2012-04-26: 12 mg via INTRAMUSCULAR
  Filled 2012-04-26: qty 2

## 2012-04-26 MED ORDER — ZOLPIDEM TARTRATE 5 MG PO TABS: 5.0000 mg | ORAL_TABLET | Freq: Every evening | ORAL | Status: DC | PRN

## 2012-04-26 MED ORDER — NIFEDIPINE 10 MG PO CAPS
10.0000 mg | ORAL_CAPSULE | Freq: Once | ORAL | Status: AC
  Administered 2012-04-26: 10 mg via ORAL
  Filled 2012-04-26: qty 1

## 2012-04-26 MED ORDER — ACETAMINOPHEN 325 MG PO TABS: 650.0000 mg | ORAL_TABLET | ORAL | Status: DC | PRN

## 2012-04-26 MED ORDER — DOCUSATE SODIUM 100 MG PO CAPS
100.0000 mg | ORAL_CAPSULE | Freq: Every day | ORAL | Status: DC
  Administered 2012-04-27 – 2012-04-28 (×2): 100 mg via ORAL
  Filled 2012-04-26 (×3): qty 1

## 2012-04-26 MED ORDER — BETAMETHASONE SOD PHOS & ACET 6 (3-3) MG/ML IJ SUSP
12.0000 mg | Freq: Once | INTRAMUSCULAR | Status: AC
  Administered 2012-04-27: 12 mg via INTRAMUSCULAR
  Filled 2012-04-26: qty 2

## 2012-04-26 MED ORDER — PENICILLIN G POTASSIUM 5000000 UNITS IJ SOLR
2.5000 10*6.[IU] | INTRAVENOUS | Status: DC
  Administered 2012-04-26 – 2012-04-28 (×14): 2.5 10*6.[IU] via INTRAVENOUS
  Filled 2012-04-26 (×17): qty 2.5

## 2012-04-26 MED ORDER — PENICILLIN G POTASSIUM 5000000 UNITS IJ SOLR
5.0000 10*6.[IU] | Freq: Once | INTRAVENOUS | Status: AC
  Administered 2012-04-26: 5 10*6.[IU] via INTRAVENOUS
  Filled 2012-04-26: qty 5
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
## 2012-04-26 NOTE — Progress Notes (Signed)
Patient reports UCs slightly more painful, but does not appear very uncomfortable. Had a small amount of bleeding over last hour, with approx 2-3 cm stain of dark blood on pad. Friend of patient's partner at bedside--patient reports her partner, "Merlyn Albert", will be coming as soon as someone can take care of the other children.  FHR reactive. UCs q 5 min, mild/moderate to palpation. Cervix 3 cm, 70-80%, vtx, -2. Current pad has additional 2 cm stain, with small amount of blood on glove after exam. No heavy active bleeding noted at present. Abdomen soft, NT between UCs.  Consulted with Dr. Su Hilt. Procardia 10 mg po now. Recheck CBC in am. Close observation of maternal/fetal status.  Nigel Bridgeman, CNM 04/26/12 6:20pm

## 2012-04-26 NOTE — Progress Notes (Signed)
Received Procardia 10 mg po at 11:05am, with resolution of UCs to irritability pattern. Minimal spotting at present. FHR reassuring--occasional very quick, mild variables. Received 1st dose Betamethasone at 7:45am.  Results for orders placed during the hospital encounter of 04/26/12 (from the past 24 hour(s))  TYPE AND SCREEN     Status: Normal   Collection Time   04/26/12  5:30 AM      Component Value Range   ABO/RH(D) O POS     Antibody Screen NEG     Sample Expiration 04/29/2012    CBC     Status: Abnormal   Collection Time   04/26/12  5:32 AM      Component Value Range   WBC 8.5  4.0 - 10.5 K/uL   RBC 2.85 (*) 3.87 - 5.11 MIL/uL   Hemoglobin 8.9 (*) 12.0 - 15.0 g/dL   HCT 16.1 (*) 09.6 - 04.5 %   MCV 87.4  78.0 - 100.0 fL   MCH 31.2  26.0 - 34.0 pg   MCHC 35.7  30.0 - 36.0 g/dL   RDW 40.9  81.1 - 91.4 %   Platelets 165  150 - 400 K/uL  CBC     Status: Abnormal   Collection Time   04/26/12 10:50 AM      Component Value Range   WBC 10.2  4.0 - 10.5 K/uL   RBC 2.81 (*) 3.87 - 5.11 MIL/uL   Hemoglobin 8.7 (*) 12.0 - 15.0 g/dL   HCT 78.2 (*) 95.6 - 21.3 %   MCV 86.5  78.0 - 100.0 fL   MCH 31.0  26.0 - 34.0 pg   MCHC 35.8  30.0 - 36.0 g/dL   RDW 08.6  57.8 - 46.9 %   Platelets 146 (*) 150 - 400 K/uL   Assessment/Plan: 31 6/7 weeks  3rd trimester bleeding Anemia from bleeding, but Hgb stable from initial value today  Continue close observation of bleeding, maternal/fetal status. MD consult prn. If contractions and bleeding remain stable, may allow patient to eat.  Nigel Bridgeman, CNM 04/26/12 12:10

## 2012-04-26 NOTE — Progress Notes (Signed)
Hospital day # 0 pregnancy at [redacted]w[redacted]d--admitted with episode of heavy bleeding.    S:  Resting quietly--current in Antenatal Unit      Perception of contractions: "Same as usual"--aware of           contractions, but not uncomfortable.        Vaginal bleeding: None at present--new pad applied on       arrival to Ante Unit       Vaginal discharge:  None  O: BP 102/57  Pulse 92  Temp 98.5 F (36.9 C) (Oral)  Resp 18  Ht 5' (1.524 m)  Wt 123 lb (55.792 kg)  BMI 24.02 kg/m2  SpO2 100%  Breastfeeding? Unknown      Fetal tracings:  Category 1      Contractions:   q 5 min, mild per patient report      Uterus non-tender      Extremities: no significant edema and no signs of DVT      No bleeding at present.          Labs:  See admit note       Meds: Betamethasone 1st dose--just obtained from           pharmacy, administration pending.  A: [redacted]w[redacted]d with bleeding, possible early abruption      Stable FHR and maternal status      Possible early labor  P: Continue current plan of care      Reviewed plan of care with patient--if increased labor, will      transfer to BS.  Needs close observation      MDs will follow      GBS today--PCN until resulted.  Nigel Bridgeman CNM, [place] 04/26/2012 7:17 AM
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
## 2012-04-26 NOTE — MAU Note (Signed)
Pt arrived via EMS, moderate amount of bright red bleeding noted on sheet under patient. Pt states bleeding and contractions started at 0300 this morning. No fetal movement since 0400 this morning. First appointment with [facility] was last week. States hasn't had an ultrasound yet.

## 2012-04-26 NOTE — Progress Notes (Signed)
Patient comfortable, still has UCs q 5-8 min, but no more uncomfortable.  Resting comfortably.  FHR reassuring, very occasional mild variable. Very scant BRB on pad--no active bleeding noted.  Consulted with Dr. Su Hilt. Procardia 10 mg po now x 1 dose. Recheck CBC--has been 6 hours since initial lab.  Hgb 8.9 at that time (down from 11.2 on 04/21/12).  Nigel Bridgeman, CNM 04/26/12 10:53a

## 2012-04-27 ENCOUNTER — Other Ambulatory Visit: Payer: Medicaid Other

## 2012-04-27 ENCOUNTER — Encounter: Payer: Medicaid Other | Admitting: Obstetrics and Gynecology

## 2012-04-27 DIAGNOSIS — O4100X0 Oligohydramnios, unspecified trimester, not applicable or unspecified: Secondary | ICD-10-CM | POA: Diagnosis present

## 2012-04-27 DIAGNOSIS — O4590 Premature separation of placenta, unspecified, unspecified trimester: Secondary | ICD-10-CM | POA: Diagnosis present

## 2012-04-27 DIAGNOSIS — O09899 Supervision of other high risk pregnancies, unspecified trimester: Secondary | ICD-10-CM | POA: Insufficient documentation

## 2012-04-27 LAB — CBC
Hemoglobin: 7.5 g/dL — ABNORMAL LOW (ref 12.0–15.0)
MCH: 31.4 pg (ref 26.0–34.0)
MCHC: 35.5 g/dL (ref 30.0–36.0)

## 2012-04-27 MED ORDER — NIFEDIPINE 10 MG PO CAPS
10.0000 mg | ORAL_CAPSULE | Freq: Four times a day (QID) | ORAL | Status: DC
  Administered 2012-04-27 – 2012-04-28 (×3): 10 mg via ORAL
  Filled 2012-04-27 (×3): qty 1

## 2012-04-27 NOTE — Progress Notes (Signed)
Ur chart review completed.  

## 2012-04-27 NOTE — Progress Notes (Addendum)
Patient ID: Gabriela Benton, female   DOB: [birth year], 21 y.o.   MRN: [id number]  Hospital day # 1 pregnancy at [redacted]w[redacted]d  S: doing well, reports good fetal activity, c/o increased cramping this AM      Contractions:none, more recently every 5-10, w frequent UI      Vaginal bleeding:bright red, spotting and about 4cm BRB on pad overnight, currently scant brb on pad,        Vaginal discharge: no significant change  O: BP 90/53  Pulse 88  Temp 98 F (36.7 C) (Oral)  Resp 20  Ht 5' (1.524 m)  Wt 123 lb (55.792 kg)  BMI 24.02 kg/m2  SpO2 96%  Breastfeeding? Unknown      Fetal tracings:BL 130, mod variability, occ mild variables, +accels, CAT 2      Uterus gravid and non-tender      Extremities: no significant edema and no signs of DVT A&O x3 Heart RRR Lungs CTAB abd soft NT, BS x4 VE = 3/80/-1, bloody show, mucousy on glove  A: [redacted]w[redacted]d with 3rd trimester bleeding, PTL, cervical dilation     stable  P: C/w DR Estes Lehner, orders to check cervix, will order procardia 10mg  q6 PRN Otherwise continue current care MD to follow   Malissa Hippo  CNM 04/27/2012 11:48 AM   Seen and agreed. If labor starts, will deliver. If bleeding increases, may have to proceed with emergent c/s. Pt understands plan of care.

## 2012-04-28 ENCOUNTER — Inpatient Hospital Stay (HOSPITAL_COMMUNITY): Payer: Medicaid Other

## 2012-04-28 LAB — CULTURE, BETA STREP (GROUP B ONLY)

## 2012-04-28 LAB — CBC WITH DIFFERENTIAL/PLATELET
Basophils Absolute: 0 10*3/uL (ref 0.0–0.1)
Basophils Relative: 0 % (ref 0–1)
Eosinophils Absolute: 0 10*3/uL (ref 0.0–0.7)
Eosinophils Relative: 0 % (ref 0–5)
HCT: 20.1 % — ABNORMAL LOW (ref 36.0–46.0)
MCHC: 35.3 g/dL (ref 30.0–36.0)
MCV: 88.9 fL (ref 78.0–100.0)
Monocytes Absolute: 1.3 10*3/uL — ABNORMAL HIGH (ref 0.1–1.0)
Neutro Abs: 14.8 10*3/uL — ABNORMAL HIGH (ref 1.7–7.7)
RDW: 13.5 % (ref 11.5–15.5)

## 2012-04-28 MED ORDER — NIFEDIPINE 10 MG PO CAPS: 10.0000 mg | ORAL_CAPSULE | Freq: Four times a day (QID) | ORAL | Status: DC | PRN

## 2012-04-28 NOTE — Progress Notes (Signed)
Pt without complaints.  No leakage of fluid or VB today.  Good FM  BP 107/60  Pulse 88  Temp 97.8 F (36.6 C) (Oral)  Resp 18  Ht 5' (1.524 m)  Wt 123 lb (55.792 kg)  BMI 24.02 kg/m2  SpO2 96%  FHTS Baseline: 125 bpm, Variability: Fair (1-6 bpm), Accelerations: Reactive and Decelerations: Variable: mild  Toco irregular, every 5-20 minutes  Pt in NAD CV RRR Lungs CTAB abd  Gravid soft and NT GU no vb EXt no calf tenderness Results for orders placed during the hospital encounter of 04/26/12 (from the past 72 hour(s))  TYPE AND SCREEN     Status: Normal (Preliminary result)   Collection Time   04/26/12  5:30 AM      Component Value Range Comment   ABO/RH(D) O POS      Antibody Screen NEG      Sample Expiration 04/29/2012      Unit Number [id number]      Blood Component Type RED CELLS,LR      Unit division 00      Status of Unit REL FROM [facility]      Transfusion Status OK TO TRANSFUSE      Crossmatch Result Compatible      Unit Number [id number]      Blood Component Type RED CELLS,LR      Unit division 00      Status of Unit ALLOCATED      Transfusion Status OK TO TRANSFUSE      Crossmatch Result Compatible      Unit Number [id number]      Blood Component Type RED CELLS,LR      Unit division 00      Status of Unit ALLOCATED      Transfusion Status OK TO TRANSFUSE      Crossmatch Result Compatible     ABO/RH     Status: Normal   Collection Time   04/26/12  5:30 AM      Component Value Range Comment   ABO/RH(D) O POS     CBC     Status: Abnormal   Collection Time   04/26/12  5:32 AM      Component Value Range Comment   WBC 8.5  4.0 - 10.5 K/uL    RBC 2.85 (*) 3.87 - 5.11 MIL/uL    Hemoglobin 8.9 (*) 12.0 - 15.0 g/dL    HCT 30.8 (*) 65.7 - 46.0 %    MCV 87.4  78.0 - 100.0 fL    MCH 31.2  26.0 - 34.0 pg    MCHC 35.7  30.0 - 36.0 g/dL    RDW 84.6  96.2 - 95.2 %    Platelets 165  150 - 400 K/uL   CULTURE, BETA STREP (GROUP B ONLY)     Status: Normal   Collection Time   04/26/12  7:40 AM      Component Value Range Comment   Specimen Description VAGINAL/RECTAL      Special Requests NONE      Culture NO GROUP B STREP (S.AGALACTIAE) ISOLATED      Report Status 04/28/2012 FINAL     CBC     Status: Abnormal   Collection Time   04/26/12 10:50 AM      Component Value Range Comment   WBC 10.2  4.0 - 10.5 K/uL    RBC 2.81 (*) 3.87 - 5.11 MIL/uL    Hemoglobin 8.7 (*) 12.0 - 15.0 g/dL  HCT 24.3 (*) 36.0 - 46.0 %    MCV 86.5  78.0 - 100.0 fL    MCH 31.0  26.0 - 34.0 pg    MCHC 35.8  30.0 - 36.0 g/dL    RDW 14.7  82.9 - 56.2 %    Platelets 146 (*) 150 - 400 K/uL   PREPARE RBC (CROSSMATCH)     Status: Normal   Collection Time   04/26/12  7:00 PM      Component Value Range Comment   Order Confirmation ORDER PROCESSED BY BLOOD BANK     CBC     Status: Abnormal   Collection Time   04/27/12  5:51 AM      Component Value Range Comment   WBC 15.4 (*) 4.0 - 10.5 K/uL    RBC 2.39 (*) 3.87 - 5.11 MIL/uL    Hemoglobin 7.5 (*) 12.0 - 15.0 g/dL    HCT 13.0 (*) 86.5 - 46.0 %    MCV 88.3  78.0 - 100.0 fL    MCH 31.4  26.0 - 34.0 pg    MCHC 35.5  30.0 - 36.0 g/dL    RDW 78.4  69.6 - 29.5 %    Platelets 165  150 - 400 K/uL   CBC WITH DIFFERENTIAL     Status: Abnormal   Collection Time   04/28/12  5:15 AM      Component Value Range Comment   WBC 18.0 (*) 4.0 - 10.5 K/uL    RBC 2.26 (*) 3.87 - 5.11 MIL/uL    Hemoglobin 7.1 (*) 12.0 - 15.0 g/dL    HCT 28.4 (*) 13.2 - 46.0 %    MCV 88.9  78.0 - 100.0 fL    MCH 31.4  26.0 - 34.0 pg    MCHC 35.3  30.0 - 36.0 g/dL    RDW 44.0  10.2 - 72.5 %    Platelets 152  150 - 400 K/uL    Neutrophils Relative 82 (*) 43 - 77 %    Neutro Abs 14.8 (*) 1.7 - 7.7 K/uL    Lymphocytes Relative 10 (*) 12 - 46 %    Lymphs Abs 1.9  0.7 - 4.0 K/uL    Monocytes Relative 7  3 - 12 %    Monocytes Absolute 1.3 (*) 0.1 - 1.0 K/uL    Eosinophils Relative 0  0 - 5 %    Eosinophils Absolute 0.0  0.0 - 0.7 K/uL    Basophils Relative 0  0 - 1 %     Basophils Absolute 0.0  0.0 - 0.1 K/uL   PREPARE RBC (CROSSMATCH)     Status: Normal   Collection Time   04/28/12  1:30 PM      Component Value Range Comment   Order Confirmation ORDER PROCESSED BY BLOOD BANK       Assessment and Plan [redacted]w[redacted]d  Abruption SGA Anemia Monitor closely Iron replacement and pt typed and crossed She has completed beta methasone if she goes into labor I would not give tocolysis Pt agrees with the plan

## 2012-04-28 NOTE — Progress Notes (Signed)
Gabriela Benton is a 21 y.o. G3P2002 at [redacted]w[redacted]d  Length of Stay:  2  Days Subjective: Patient reports the fetal movement as active. Patient reports uterine contraction  activity as denies uc. Patient reports  vaginal bleeding as red with wiping only when up to bathroom. Patient describes fluid per vagina as None.  Vitals:  Blood pressure 107/60, pulse 88, temperature 97.8 F (36.6 C), temperature source Oral, resp. rate 18, height 5' (1.524 m), weight 123 lb (55.792 kg), SpO2 96.00%. Physical Examination:  General appearance - alert, well appearing, and in no distress Physical exam: Calm, no distress, HEENT wnl lungs clear bilaterally, AP RRR, abd soft, gravid, nt, bowel sounds active, abdomen nontender, gravid No edema to lower extremities Fetal Monitoring:  Variability: min to mod 130s uc without  Labs:  Recent Results (from the past 24 hour(s))  CBC WITH DIFFERENTIAL   Collection Time   04/28/12  5:15 AM      Component Value Range   WBC 18.0 (*) 4.0 - 10.5 K/uL   RBC 2.26 (*) 3.87 - 5.11 MIL/uL   Hemoglobin 7.1 (*) 12.0 - 15.0 g/dL   HCT 10.2 (*) 72.5 - 36.6 %   MCV 88.9  78.0 - 100.0 fL   MCH 31.4  26.0 - 34.0 pg   MCHC 35.3  30.0 - 36.0 g/dL   RDW 44.0  34.7 - 42.5 %   Platelets 152  150 - 400 K/uL   Neutrophils Relative 82 (*) 43 - 77 %   Neutro Abs 14.8 (*) 1.7 - 7.7 K/uL   Lymphocytes Relative 10 (*) 12 - 46 %   Lymphs Abs 1.9  0.7 - 4.0 K/uL   Monocytes Relative 7  3 - 12 %   Monocytes Absolute 1.3 (*) 0.1 - 1.0 K/uL   Eosinophils Relative 0  0 - 5 %   Eosinophils Absolute 0.0  0.0 - 0.7 K/uL   Basophils Relative 0  0 - 1 %   Basophils Absolute 0.0  0.0 - 0.1 K/uL  PREPARE RBC (CROSSMATCH)   Collection Time   04/28/12  1:30 PM      Component Value Range   Order Confirmation ORDER PROCESSED BY BLOOD BANK      Imaging Studies:    Ultrasound AFI 10.9, plancenta anterior, vtx, anatomy WNL  AC and LF 3% 18% growth Cervix 2 cm  Medications:  Scheduled    . docusate  sodium  100 mg Oral Daily  . pencillin G potassium IV  2.5 Million Units Intravenous Q4H  . prenatal multivitamin  1 tablet Oral Daily  . DISCONTD: NIFEdipine  10 mg Oral Q6H   I have reviewed the patient's current medications.  ASSESSMENT: Patient Active Problem List  Diagnosis  . Normal pregnancy, incidental  . History of VBAC  . H/O: cesarean section  . Late prenatal care  . Preterm labor  . Placental abruption, antepartum  . Oligohydramnios  . Short interval between pregnancies complicating pregnancy, antepartum  [redacted]w[redacted]d chronic abruption 18% growth Continue care [facility], Chizara Mena 04/28/2012,1:34 PM

## 2012-04-28 NOTE — Progress Notes (Signed)
GBS negative. PCN discontinued per Dr. Normand Sloop.  Gabriela Benton, CNM, [place] 04/28/12 8p

## 2012-04-29 LAB — TYPE AND SCREEN
ABO/RH(D): O POS
Antibody Screen: NEGATIVE
Unit division: 0

## 2012-04-29 NOTE — Progress Notes (Signed)
21 y.o. year old female,at [redacted]w[redacted]d gestation.  SUBJECTIVE:  No bleeding. More nauseated today.  OBJECTIVE:  BP 113/72  Pulse 89  Temp 98.7 F (37.1 C) (Oral)  Resp 20  Ht 5' (1.524 m)  Wt 123 lb (55.792 kg)  BMI 24.02 kg/m2  SpO2 96%  Fetal Heart Tones:  Cat 1  Contractions:          few  Abd: soft, nontender  ASSESSMENT:  [redacted]w[redacted]d Weeks Pregnancy  Improved bleeding  Anemia  SGA  PLAN:  Allow limited ambulation.  Possible discharge tomorrow.  Leonard Schwartz, M.D.

## 2012-04-30 ENCOUNTER — Inpatient Hospital Stay (HOSPITAL_COMMUNITY): Payer: Medicaid Other

## 2012-04-30 NOTE — Progress Notes (Signed)
I received a referral from nurse to visit this pt.  Alara was doing as well as can be expected with the circumstances.  We spoke for a few minutes, but she said that she was doing okay for the moment.  She is aware of on-going availability of chaplain support.  Please page as needed, [phone].  Agnes Lawrence Delmont Prosch 12:18 PM  04/30/12 1200  Clinical Encounter Type  Visited With Patient  Visit Type Initial  Stress Factors  Patient Stress Factors (Unexpected hospital admission)

## 2012-04-30 NOTE — Progress Notes (Addendum)
Gabriela Benton is a 21 y.o. [id number] at [redacted]w[redacted]d Patient Active Problem List  Diagnosis  . Normal pregnancy, incidental  . History of VBAC  . H/O: cesarean section  . Late prenatal care  . Preterm labor  . Placental abruption, antepartum  . Oligohydramnios  . Short interval between pregnancies complicating pregnancy, antepartum   Subjective: Has not been up to the shower or walking, tired now and I want to sleep, denies srom, vag bleeding just some brown mucus discharge, no uc, no abdominal pain, with +FM  Pregnancy complications: placental abruption chronic  Objective: BP 112/64  Pulse 81  Temp 98.4 F (36.9 C) (Oral)  Resp 20  Ht 5' (1.524 m)  Wt 128 lb 6.4 oz (58.242 kg)  BMI 25.08 kg/m2  SpO2 96%      Physical Exam:  Gen: calm, quiet no distress Chest/Lungs: cta bilaterally  Heart/Pulse: RRR  Abdomen: soft, gravid, nontender, BX x4 quad Uterine fundus: soft, nontender Skin & Color: warm and dry  Neurological: AOx3 EXT: negative Homan's b/l, edema no  FHT:  Category 1 UC:   few SVE:   Dilation: 3 Effacement (%): 80 Station: -2 Exam by:: Gabriela Benton cnm  Labs: Lab Results  Component Value Date   WBC 18.0* 04/28/2012   HGB 7.1* 04/28/2012   HCT 20.1* 04/28/2012   MCV 88.9 04/28/2012   PLT 152 04/28/2012    Assessment and Plan: [redacted]w[redacted]d Patient Active Problem List  Diagnosis  . Normal pregnancy, incidental  . History of VBAC  . H/O: cesarean section  . Late prenatal care  . Preterm labor  . Placental abruption, antepartum  . Oligohydramnios  . Short interval between pregnancies complicating pregnancy, antepartum   Discussed ambulation after shower for consideration of discharge home, continue care. Gabriela Benton, CNM 04/30/2012, 10:35 AM  Agree with above.  AFI ok on last u/s.  Pt to increase activity (ie up to bathroom and shower) today and if tolerates well, may consider d/c home tomorrow.  BPP in am.

## 2012-04-30 NOTE — Progress Notes (Signed)
UR Chart review completed.  

## 2012-04-30 NOTE — Progress Notes (Signed)
Off monitor to ambulate in hallways.  Precautions given.

## 2012-05-01 ENCOUNTER — Inpatient Hospital Stay (HOSPITAL_COMMUNITY)
Admit: 2012-05-01 | Discharge: 2012-05-01 | Disposition: A | Discharge status: Home / Self Care | Payer: Medicaid Other | Attending: Obstetrics and Gynecology | Admitting: Obstetrics and Gynecology

## 2012-05-01 ENCOUNTER — Inpatient Hospital Stay (HOSPITAL_COMMUNITY): Payer: Medicaid Other

## 2012-05-01 DIAGNOSIS — O365990 Maternal care for other known or suspected poor fetal growth, unspecified trimester, not applicable or unspecified: Secondary | ICD-10-CM

## 2012-05-01 DIAGNOSIS — O26879 Cervical shortening, unspecified trimester: Secondary | ICD-10-CM

## 2012-05-01 NOTE — Progress Notes (Signed)
Patient ID: Gabriela Benton, female   DOB: 06-29-1991, 21 y.o.   MRN: [id number] Gabriela Benton is a 21 y.o. G3P2002 at [redacted]w[redacted]d by LMP admitted for third trimester bleeding thought to be consistent with an otherwise asymptomatic abruption  Subjective: GI: negative GU: Denies: dysuria, frequency/urgency, hematuria, pelvic pain OB: she noted a small amount of bright red bleeding when she wiped after urination this morning. She had had no vaginal bleeding for the last 24 hours.        Objective: BP 99/62  Pulse 86  Temp 98.1 F (36.7 C) (Oral)  Resp 20  Ht 5' (1.524 m)  Wt 128 lb 6.4 oz (58.242 kg)  BMI 25.08 kg/m2  SpO2 96%      FHT:  FHR: 140s bpm, variability: moderate,  accelerations:  Present,  decelerations:  Present but rare variables UC:   0-1 per hour SVE:   Dilation: 3 Effacement (%): 80 Station: -2 Exam by:: v. latham cnm on admission BPP 8/8  Ultrasound: On 04/28/2012 the patient had an ultrasound which showed an estimated fetal weight of 3 lbs. 10 oz. Which was is 2 weeks behind prior dating ultrasound.  Fluid is norma Labs: Lab Results  Component Value Date   WBC 18.0* 04/28/2012   HGB 7.1* 04/28/2012   HCT 20.1* 04/28/2012   MCV 88.9 04/28/2012   PLT 152 04/28/2012    Assessment:: IUP at 32 weeks and 4 days Third trimester vaginal bleeding most consistent with an abruption. 24 hours of stable fetal status with no bleeding followed by a small amount of bright red bleeding this morning Continued stable fetal status Severe asymptomatic anemia Status post betamethasone series Concern for IUGR   Fetal Wellbeing:  Category II  Plan:  I have recommended transfusion to the patient given her severe anemia and episode of bleeding which may very well be repeated. The risks of infection fluid overload and transfusion reaction were reviewed and the patient refused. I asked whether she would consider transfusion if she started to bleed significantly again and she said that she would  reconsider at that time. Will observe the patient in hospital to see whether additional bleeding occurs Consider MFM consult   Deandre Brannan P 05/01/2012, 9:21 AM

## 2012-05-01 NOTE — Progress Notes (Signed)
Patient seen for BPP and Doppler studies.  See report in [facility].  Alpha Gula, MD Single IUP at 32 4/7 weeks Active fetus with BPP of 8/8 Normal umbilical artery Doppler studies for this gestational age Normal amniotic fluid volume  Recommend follow up ultrasounds as clinically indicated
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
## 2012-05-02 ENCOUNTER — Encounter (HOSPITAL_COMMUNITY): Payer: Self-pay | Admitting: Emergency Medicine

## 2012-05-02 DIAGNOSIS — O43899 Other placental disorders, unspecified trimester: Secondary | ICD-10-CM

## 2012-05-02 DIAGNOSIS — O368990 Maternal care for other specified fetal problems, unspecified trimester, not applicable or unspecified: Secondary | ICD-10-CM

## 2012-05-02 DIAGNOSIS — O34219 Maternal care for unspecified type scar from previous cesarean delivery: Secondary | ICD-10-CM | POA: Diagnosis not present

## 2012-05-02 DIAGNOSIS — O4590 Premature separation of placenta, unspecified, unspecified trimester: Secondary | ICD-10-CM

## 2012-05-02 LAB — CBC
MCH: 31.6 pg (ref 26.0–34.0)
MCHC: 35.7 g/dL (ref 30.0–36.0)
Platelets: 192 10*3/uL (ref 150–400)

## 2012-05-02 MED ORDER — IBUPROFEN 600 MG PO TABS: 600.0000 mg | ORAL_TABLET | Freq: Four times a day (QID) | ORAL | Status: DC | PRN

## 2012-05-02 MED ORDER — OXYTOCIN BOLUS FROM INFUSION
250.0000 mL | Freq: Once | INTRAVENOUS | Status: DC
  Filled 2012-05-02: qty 500

## 2012-05-02 MED ORDER — LIDOCAINE HCL (PF) 1 % IJ SOLN
INTRAMUSCULAR | Status: AC
  Filled 2012-05-02: qty 30

## 2012-05-02 MED ORDER — LACTATED RINGERS IV BOLUS (SEPSIS): 500.0000 mL | Freq: Once | INTRAVENOUS | Status: DC

## 2012-05-02 MED ORDER — IBUPROFEN 600 MG PO TABS
600.0000 mg | ORAL_TABLET | Freq: Four times a day (QID) | ORAL | Status: DC
  Administered 2012-05-02 – 2012-05-04 (×9): 600 mg via ORAL
  Filled 2012-05-02 (×8): qty 1

## 2012-05-02 MED ORDER — IBUPROFEN 600 MG PO TABS
ORAL_TABLET | ORAL | Status: AC
  Filled 2012-05-02: qty 1

## 2012-05-02 MED ORDER — OXYCODONE-ACETAMINOPHEN 5-325 MG PO TABS
ORAL_TABLET | ORAL | Status: AC
  Filled 2012-05-02: qty 1

## 2012-05-02 MED ORDER — ONDANSETRON HCL 4 MG/2ML IJ SOLN: 4.0000 mg | Freq: Four times a day (QID) | INTRAMUSCULAR | Status: DC | PRN

## 2012-05-02 MED ORDER — MEASLES, MUMPS & RUBELLA VAC ~~LOC~~ INJ
0.5000 mL | INJECTION | Freq: Once | SUBCUTANEOUS | Status: DC
  Filled 2012-05-02: qty 0.5

## 2012-05-02 MED ORDER — DIPHENHYDRAMINE HCL 25 MG PO CAPS: 25.0000 mg | ORAL_CAPSULE | Freq: Four times a day (QID) | ORAL | Status: DC | PRN

## 2012-05-02 MED ORDER — LIDOCAINE HCL (PF) 1 % IJ SOLN: 30.0000 mL | INTRAMUSCULAR | Status: DC | PRN

## 2012-05-02 MED ORDER — OXYTOCIN 40 UNITS IN LACTATED RINGERS INFUSION - SIMPLE MED
INTRAVENOUS | Status: AC
  Administered 2012-05-02: 40 [IU] via INTRAVENOUS
  Filled 2012-05-02: qty 1000

## 2012-05-02 MED ORDER — ONDANSETRON HCL 4 MG PO TABS: 4.0000 mg | ORAL_TABLET | ORAL | Status: DC | PRN

## 2012-05-02 MED ORDER — OXYTOCIN 40 UNITS IN LACTATED RINGERS INFUSION - SIMPLE MED
62.5000 mL/h | Freq: Once | INTRAVENOUS | Status: AC
  Administered 2012-05-02: 40 [IU] via INTRAVENOUS

## 2012-05-02 MED ORDER — OXYCODONE-ACETAMINOPHEN 5-325 MG PO TABS: 1.0000 | ORAL_TABLET | ORAL | Status: DC | PRN

## 2012-05-02 MED ORDER — LANOLIN HYDROUS EX OINT: TOPICAL_OINTMENT | CUTANEOUS | Status: DC | PRN

## 2012-05-02 MED ORDER — BUTORPHANOL TARTRATE 1 MG/ML IJ SOLN: 1.0000 mg | INTRAMUSCULAR | Status: DC | PRN

## 2012-05-02 MED ORDER — ONDANSETRON HCL 4 MG/2ML IJ SOLN: 4.0000 mg | INTRAMUSCULAR | Status: DC | PRN

## 2012-05-02 MED ORDER — OXYTOCIN 10 UNIT/ML IJ SOLN: 10.0000 [IU] | Freq: Once | INTRAMUSCULAR | Status: DC

## 2012-05-02 MED ORDER — SODIUM CHLORIDE 0.9 % IV SOLN
2.0000 g | Freq: Once | INTRAVENOUS | Status: AC
  Administered 2012-05-02: 2 g via INTRAVENOUS
  Filled 2012-05-02: qty 2000

## 2012-05-02 MED ORDER — WITCH HAZEL-GLYCERIN EX PADS: 1.0000 "application " | MEDICATED_PAD | CUTANEOUS | Status: DC | PRN

## 2012-05-02 MED ORDER — DIBUCAINE 1 % RE OINT
1.0000 "application " | TOPICAL_OINTMENT | RECTAL | Status: DC | PRN
  Filled 2012-05-02: qty 28

## 2012-05-02 MED ORDER — BENZOCAINE-MENTHOL 20-0.5 % EX AERO
1.0000 "application " | INHALATION_SPRAY | CUTANEOUS | Status: DC | PRN
  Filled 2012-05-02: qty 56

## 2012-05-02 MED ORDER — LACTATED RINGERS IV SOLN: 500.0000 mL | INTRAVENOUS | Status: DC | PRN

## 2012-05-02 MED ORDER — SENNOSIDES-DOCUSATE SODIUM 8.6-50 MG PO TABS
2.0000 | ORAL_TABLET | Freq: Every day | ORAL | Status: DC
  Administered 2012-05-02: 2 via ORAL

## 2012-05-02 MED ORDER — TETANUS-DIPHTH-ACELL PERTUSSIS 5-2.5-18.5 LF-MCG/0.5 IM SUSP: 0.5000 mL | Freq: Once | INTRAMUSCULAR | Status: DC

## 2012-05-02 MED ORDER — OXYCODONE-ACETAMINOPHEN 5-325 MG PO TABS
1.0000 | ORAL_TABLET | ORAL | Status: DC | PRN
  Administered 2012-05-03 – 2012-05-04 (×2): 1 via ORAL
  Filled 2012-05-02 (×2): qty 1

## 2012-05-02 MED ORDER — LACTATED RINGERS IV SOLN
INTRAVENOUS | Status: DC
  Administered 2012-05-02: 125 mL/h via INTRAVENOUS

## 2012-05-02 MED ORDER — ZOLPIDEM TARTRATE 5 MG PO TABS: 5.0000 mg | ORAL_TABLET | Freq: Every evening | ORAL | Status: DC | PRN

## 2012-05-02 MED ORDER — ACETAMINOPHEN 325 MG PO TABS: 650.0000 mg | ORAL_TABLET | ORAL | Status: DC | PRN

## 2012-05-02 MED ORDER — SIMETHICONE 80 MG PO CHEW: 80.0000 mg | CHEWABLE_TABLET | ORAL | Status: DC | PRN

## 2012-05-02 MED ORDER — CITRIC ACID-SODIUM CITRATE 334-500 MG/5ML PO SOLN: 30.0000 mL | ORAL | Status: DC | PRN

## 2012-05-02 MED ORDER — PRENATAL MULTIVITAMIN CH
1.0000 | ORAL_TABLET | Freq: Every day | ORAL | Status: DC
  Administered 2012-05-03 – 2012-05-04 (×2): 1 via ORAL
  Filled 2012-05-02 (×2): qty 1

## 2012-05-02 MED ORDER — LACTATED RINGERS IV SOLN: INTRAVENOUS | Status: DC

## 2012-05-02 NOTE — Consult Note (Signed)
Neonatology Note:  Attendance at Delivery:  I was asked to attend this NSVD at 32 4/7 weeks following onset of PTL today. The mother is a G3P2 O pos, GBS pos with asthma. She was admitted 8/4 with vaginal bleeding, felt to be a chronic abruption. She received Betamethasone on 8/4-5 and also got 1 dose of Pen G at admission; she is getting a dose of Ampicillin at time of delivery and is afebrile. Prenatal ultrasound has shown the baby to be IUGR with a EFW of about 1500 grams. BPP was 8/8 yesterday. ROM just prior to delivery, fluid clear. Infant vigorous with good spontaneous cry and tone. Needed no bulb suctioning. Ap 9/9. Lungs clear to ausc in DR. Held by mother briefly in the DR, then transported to the NICU in room air.  Miette Molenda, MD      

## 2012-05-03 LAB — CBC
HCT: 21.7 % — ABNORMAL LOW (ref 36.0–46.0)
Hemoglobin: 7.6 g/dL — ABNORMAL LOW (ref 12.0–15.0)
MCH: 31 pg (ref 26.0–34.0)
MCHC: 35 g/dL (ref 30.0–36.0)
MCV: 88.6 fL (ref 78.0–100.0)
RDW: 13.6 % (ref 11.5–15.5)

## 2012-05-03 MED ORDER — POLYSACCHARIDE IRON COMPLEX 150 MG PO CAPS
150.0000 mg | ORAL_CAPSULE | Freq: Two times a day (BID) | ORAL | Status: DC
  Administered 2012-05-03 – 2012-05-04 (×3): 150 mg via ORAL
  Filled 2012-05-03 (×5): qty 1
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
## 2012-05-03 NOTE — Progress Notes (Signed)
Post Partum Day 1 Subjective: no complaints, up ad lib, voiding, tolerating PO, + flatus and VB light; 2 other children and 3 visitors at bedside.  Pumping w/ good output.  Reports told by NICU that infant would be ready for d/c in about 2 weeks if things continue going well.  No dizziness when up.  Hasn't been down to NICU yet this morning.  RN reports all 5 guests stayed overnight in pt's room.  Objective: Blood pressure 106/72, pulse 85, temperature 98.2 F (36.8 C), temperature source Oral, resp. rate 18, height 5' (1.524 m), weight 128 lb (58.06 kg), SpO2 99.00%, unknown if currently breastfeeding.  Physical Exam:  General: alert, cooperative and no distress Lochia: appropriate Uterine Fundus: firm, @ umb Incision: n/a DVT Evaluation: No evidence of DVT seen on physical exam. Negative Homan's sign. No significant calf/ankle edema.   Basename 05/03/12 0550 05/02/12 0919  HGB 7.6* 8.4*  HCT 21.7* 23.5*    Assessment/Plan: Plan for discharge tomorrow, Breastfeeding and Lactation consult PO iron supplement bid; orthostatic VS.  Support given r/e PTD and infant in NICU. Will discuss LT BC method before d/c tomorrow, possibly DMPA before d/c if pt agreeable. S/p [redacted]w[redacted]d SVD; abruption.  LOS: 7 days   Zarea Diesing H 05/03/2012, 10:05 AM

## 2012-05-04 MED ORDER — OXYCODONE-ACETAMINOPHEN 5-325 MG PO TABS: 1.0000 | ORAL_TABLET | ORAL | Status: AC | PRN

## 2012-05-04 MED ORDER — IBUPROFEN 600 MG PO TABS: 600.0000 mg | ORAL_TABLET | Freq: Four times a day (QID) | ORAL | Status: AC | PRN

## 2012-05-04 MED ORDER — POLYSACCHARIDE IRON COMPLEX 150 MG PO CAPS: 150.0000 mg | ORAL_CAPSULE | Freq: Two times a day (BID) | ORAL | Status: DC

## 2012-05-04 NOTE — Progress Notes (Signed)
Ur chart review completed.  

## 2012-05-04 NOTE — Discharge Summary (Signed)
Obstetric Discharge Summary Reason for Admission: 3rd trimester vaginal bleeding and PTL, possible abruption, chronic anemia Prenatal Procedures: NST and ultrasound, Betamethasone, Procardia Intrapartum Procedures: spontaneous vaginal delivery, VBAC Postpartum Procedures: none Complications-Operative and Postpartum: Anemia Hemoglobin  Date Value Range Status  05/03/2012 7.6* 12.0 - 15.0 g/dL Final     HCT  Date Value Range Status  05/03/2012 21.7* 36.0 - 46.0 % Final   Hospital Course: Admitted on 04/26/12 with vaginal bleeding and cervix 3 cm.  Presumptive diagnosis was possible abruption.  US showed a fetus with IUGR and normal fluid.  Betamethasone course was administered, PCN given due to unknown GBS status initially (eventually resulted as positive), and Procardia 10 mg po was given sporadically for contractions.  She was noted to have anemia, with Hgb of 8.9 on admission, with a nadir to 7.1 on 8/6.  She remained stable until 05/02/12, when she had increased contractions and was fully dilated very quickly, with VBAC delivery by Sanda Klein, CNM. Baby was taken to NICU for prematurity and weight of 3+15, but remained stable during the rest of the patient's stay.  Patient had an uncomplicated postpartum course, with breast feeding going well. She planned Nexplanon, but declined the offer of insertion on the day of discharge by Dr. Estanislado Pandy.  On 05/04/12, the patient's physical exam was WNL, and she was discharged home in stable condition.  SW consult had occurred, with no significant social risks identified. She received adequate benefit from po pain medications and was sent home with Motrin and Percocet.  Orthostatic vs were WNL, and the patient had no syncope or dizziness during her hospital stay.  She was placed on Fe during her pp course.  Examination of the placenta at the time of delivery demonstrated approx 4 cm area of clot adherent to the placenta.  Placenta was sent to path for  eval.       Physical Exam:  General: alert Lochia: appropriate Uterine Fundus: firm Incision: No lacerations present DVT Evaluation: No evidence of DVT seen on physical exam. Negative Homan's sign.  Discharge Diagnoses: Premature labor and abruption, anemia, VBAC  Discharge Information: Date: 05/04/2012 Activity: Per [facility] handout Diet: routine Medications: Ibuprofen, Percocet and Fe Condition: stable Instructions: refer to practice specific booklet Discharge to: home Contraception:  Plans Nexplanon--office will call to schedule this for 3-4 weeks pp (per Dr. Estanislado Pandy, this needs to be placed before her 6 week visit, but after 3 weeks pp to allow for appropriate establishment of patient's milk supply). Follow-up Information    Follow up with [facility] in 6 weeks. (Call to schedule appointment or as needed)          Newborn Data: Live born female  Birth Weight: 3 lb 15.1 oz (1789 g) APGAR: 9, 9  Remains in NICU at the time of patient's d/c--condition stable.  Patient is pumping for breastmilk.  Nigel Bridgeman 05/04/2012, 3:02 PM

## 2012-05-04 NOTE — Progress Notes (Signed)
Discharge instructions reviewed with patient.  Patient able to "Teach Back" home care and signs/symptoms to report to MD.  No home equipment needed.  Ambulated to car with staff without incident and discharged to are of family.

## 2012-05-12 ENCOUNTER — Emergency Department (HOSPITAL_COMMUNITY)
Admission: EM | Admit: 2012-05-12 | Discharge: 2012-05-12 | Disposition: A | Discharge status: Home / Self Care | Payer: Medicaid Other | Source: Home / Self Care | Attending: Emergency Medicine | Admitting: Emergency Medicine

## 2012-05-12 ENCOUNTER — Encounter (HOSPITAL_COMMUNITY): Payer: Self-pay | Admitting: *Deleted

## 2012-05-12 DIAGNOSIS — R220 Localized swelling, mass and lump, head: Secondary | ICD-10-CM

## 2012-05-12 MED ORDER — DOCOSANOL 10 % EX CREA: TOPICAL_CREAM | CUTANEOUS | Status: DC
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
## 2012-05-12 NOTE — ED Notes (Signed)
Swelling and irritation in her mouth for the last week

## 2012-05-22 ENCOUNTER — Encounter: Payer: Medicaid Other | Admitting: Obstetrics and Gynecology

## 2012-06-01 ENCOUNTER — Encounter: Payer: Self-pay | Admitting: Obstetrics and Gynecology

## 2012-06-01 ENCOUNTER — Ambulatory Visit (INDEPENDENT_AMBULATORY_CARE_PROVIDER_SITE_OTHER): Payer: Medicaid Other | Admitting: Obstetrics and Gynecology

## 2012-06-01 ENCOUNTER — Encounter: Payer: Medicaid Other | Admitting: Obstetrics and Gynecology

## 2012-06-01 VITALS — BP 100/60 | Resp 16 | Wt 110.0 lb

## 2012-06-01 DIAGNOSIS — Z30017 Encounter for initial prescription of implantable subdermal contraceptive: Secondary | ICD-10-CM

## 2012-06-01 DIAGNOSIS — Z309 Encounter for contraceptive management, unspecified: Secondary | ICD-10-CM

## 2012-06-01 MED ORDER — ETONOGESTREL 68 MG ~~LOC~~ IMPL
68.0000 mg | DRUG_IMPLANT | Freq: Once | SUBCUTANEOUS | Status: AC
  Administered 2012-06-01: 68 mg via SUBCUTANEOUS

## 2012-06-01 NOTE — Progress Notes (Signed)
LMP: pt is unsure.  INSERTION DATE: 06/01/2012 REMOVAL DATE: 06/02/2015 INSERTION ARM: Left Arm PALPATED AFTER INSERT: yes IS PT SWITCHING FROM HORMONAL BC: no LOT #: [id number] EXP: 02/2014  Gabriela Benton presented today for insertion of Nexplanon Prior to the procedure, Nexplanon 's system of contraception was explained and the patient was happy to proceed. A signed consent was obtained.  Procedure: Site: Left upper arm. The skin was prepped with alcohol prior to infiltration with local anesthesic Lidocaine 1%. Lidocaine 1% was infiltrated at the insertion entry site and along the line of the Nexplanon  Device insertion track. The skin was then prepped with Iodine x 3  Nexplanon was inserted as per manufacture's recommendations. Nexplanon was palpated under the skin and the patient was able to palpate and identify the rod. The insertion site was closed with a steri-strip and a band-aid was applied. A pressure bandage was applied. The patient was advised to leave the pressure bandage on for at least 12 hrs but 24 hrs is preferred. The patient was advised about local bruising to the site. Advised to use a barrier method of contraception for 7 days to allow the hormone to set up. Advised to return as needed.   The procedure of Insertion of Nexplanon was uncomplicated.  Earl Gala, CNM.

## 2012-07-29 ENCOUNTER — Telehealth: Payer: Self-pay | Admitting: Obstetrics and Gynecology

## 2012-07-29 NOTE — Telephone Encounter (Signed)
Spoke with pt rgd msg pt states have nexplanon and no cycle advised pt nl not to have cycle with nexplanon pt voice understanding

## 2012-08-04 ENCOUNTER — Telehealth: Payer: Self-pay | Admitting: Obstetrics and Gynecology

## 2012-08-04 NOTE — Telephone Encounter (Signed)
Tc to pt per telephone call. Pt with dizziness and nausea today. Informed pt sx's likely not related to Nexplanon inserted 06/01/12. Pt admits to inadequate water intake. Informed pt to increase water intake, try otc Tylenol or Ibuprofen, bland foods(avoiding spicy and greasy foods). Pt to contact PCP if no improvement. Pt voices understanding.

## 2012-08-15 ENCOUNTER — Encounter (HOSPITAL_COMMUNITY): Payer: Self-pay | Admitting: *Deleted

## 2012-08-15 ENCOUNTER — Emergency Department (HOSPITAL_COMMUNITY): Payer: Medicaid Other

## 2012-08-15 ENCOUNTER — Other Ambulatory Visit: Payer: Self-pay

## 2012-08-15 ENCOUNTER — Emergency Department (HOSPITAL_COMMUNITY)
Admission: EM | Admit: 2012-08-15 | Discharge: 2012-08-15 | Disposition: A | Discharge status: Home / Self Care | Payer: Medicaid Other | Attending: Emergency Medicine | Admitting: Emergency Medicine

## 2012-08-15 DIAGNOSIS — R079 Chest pain, unspecified: Secondary | ICD-10-CM

## 2012-08-15 DIAGNOSIS — R1012 Left upper quadrant pain: Secondary | ICD-10-CM | POA: Insufficient documentation

## 2012-08-15 DIAGNOSIS — Z8701 Personal history of pneumonia (recurrent): Secondary | ICD-10-CM | POA: Insufficient documentation

## 2012-08-15 DIAGNOSIS — Z87440 Personal history of urinary (tract) infections: Secondary | ICD-10-CM | POA: Insufficient documentation

## 2012-08-15 LAB — CBC WITH DIFFERENTIAL/PLATELET
Basophils Relative: 0 % (ref 0–1)
Hemoglobin: 12.3 g/dL (ref 12.0–15.0)
Lymphs Abs: 2.9 10*3/uL (ref 0.7–4.0)
MCHC: 34.3 g/dL (ref 30.0–36.0)
Monocytes Relative: 6 % (ref 3–12)
Neutro Abs: 2.6 10*3/uL (ref 1.7–7.7)
Neutrophils Relative %: 44 % (ref 43–77)
RBC: 4.39 MIL/uL (ref 3.87–5.11)
WBC: 5.9 10*3/uL (ref 4.0–10.5)

## 2012-08-15 LAB — COMPREHENSIVE METABOLIC PANEL
Albumin: 3.9 g/dL (ref 3.5–5.2)
Alkaline Phosphatase: 55 U/L (ref 39–117)
BUN: 8 mg/dL (ref 6–23)
Chloride: 107 mEq/L (ref 96–112)
Potassium: 3.7 mEq/L (ref 3.5–5.1)
Total Bilirubin: 0.2 mg/dL — ABNORMAL LOW (ref 0.3–1.2)

## 2012-08-15 LAB — POCT I-STAT TROPONIN I: Troponin i, poc: 0 ng/mL (ref 0.00–0.08)

## 2012-08-15 NOTE — ED Notes (Signed)
Pt reports left breast, chest and upper abd pain that started today, had episode of vomiting, pain increases with movement. Denies any sob or cough. No acute distress noted, resp e/u. Skin w/d. ekg done at triage.
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
## 2012-08-16 NOTE — ED Provider Notes (Signed)
Medical screening examination/treatment/procedure(s) were performed by non-physician practitioner and as supervising physician I was immediately available for consultation/collaboration.  Acelin Ferdig R Pearley Millington, MD 08/16/12 0037 

## 2013-01-11 ENCOUNTER — Emergency Department (HOSPITAL_COMMUNITY)
Admission: EM | Admit: 2013-01-11 | Discharge: 2013-01-11 | Disposition: A | Discharge status: Home / Self Care | Payer: Medicaid Other | Attending: Emergency Medicine | Admitting: Emergency Medicine

## 2013-01-11 ENCOUNTER — Encounter (HOSPITAL_COMMUNITY): Payer: Self-pay | Admitting: *Deleted

## 2013-01-11 ENCOUNTER — Encounter: Payer: Self-pay | Admitting: Emergency Medicine

## 2013-01-11 DIAGNOSIS — N390 Urinary tract infection, site not specified: Secondary | ICD-10-CM | POA: Insufficient documentation

## 2013-01-11 DIAGNOSIS — Z3202 Encounter for pregnancy test, result negative: Secondary | ICD-10-CM | POA: Insufficient documentation

## 2013-01-11 DIAGNOSIS — Z8611 Personal history of tuberculosis: Secondary | ICD-10-CM | POA: Insufficient documentation

## 2013-01-11 DIAGNOSIS — R5383 Other fatigue: Secondary | ICD-10-CM | POA: Insufficient documentation

## 2013-01-11 DIAGNOSIS — Z8619 Personal history of other infectious and parasitic diseases: Secondary | ICD-10-CM | POA: Insufficient documentation

## 2013-01-11 DIAGNOSIS — N898 Other specified noninflammatory disorders of vagina: Secondary | ICD-10-CM | POA: Insufficient documentation

## 2013-01-11 DIAGNOSIS — R112 Nausea with vomiting, unspecified: Secondary | ICD-10-CM | POA: Insufficient documentation

## 2013-01-11 DIAGNOSIS — IMO0002 Reserved for concepts with insufficient information to code with codable children: Secondary | ICD-10-CM | POA: Insufficient documentation

## 2013-01-11 DIAGNOSIS — R300 Dysuria: Secondary | ICD-10-CM | POA: Insufficient documentation

## 2013-01-11 DIAGNOSIS — R5381 Other malaise: Secondary | ICD-10-CM | POA: Insufficient documentation

## 2013-01-11 LAB — URINALYSIS, ROUTINE W REFLEX MICROSCOPIC
Glucose, UA: NEGATIVE mg/dL
Hgb urine dipstick: NEGATIVE
Ketones, ur: NEGATIVE mg/dL
Protein, ur: NEGATIVE mg/dL
pH: 7 (ref 5.0–8.0)

## 2013-01-11 LAB — CBC WITH DIFFERENTIAL/PLATELET
Eosinophils Relative: 0 % (ref 0–5)
HCT: 35.6 % — ABNORMAL LOW (ref 36.0–46.0)
Hemoglobin: 13 g/dL (ref 12.0–15.0)
Lymphocytes Relative: 32 % (ref 12–46)
Lymphs Abs: 2.3 10*3/uL (ref 0.7–4.0)
MCV: 82.6 fL (ref 78.0–100.0)
Monocytes Absolute: 0.4 10*3/uL (ref 0.1–1.0)
Monocytes Relative: 6 % (ref 3–12)
RBC: 4.31 MIL/uL (ref 3.87–5.11)
RDW: 12.6 % (ref 11.5–15.5)
WBC: 7.2 10*3/uL (ref 4.0–10.5)

## 2013-01-11 LAB — WET PREP, GENITAL

## 2013-01-11 LAB — BASIC METABOLIC PANEL
BUN: 8 mg/dL (ref 6–23)
CO2: 27 mEq/L (ref 19–32)
Calcium: 9.7 mg/dL (ref 8.4–10.5)
Creatinine, Ser: 0.73 mg/dL (ref 0.50–1.10)
Glucose, Bld: 86 mg/dL (ref 70–99)

## 2013-01-11 LAB — URINE MICROSCOPIC-ADD ON

## 2013-01-11 MED ORDER — CIPROFLOXACIN HCL 500 MG PO TABS: 500.0000 mg | ORAL_TABLET | Freq: Two times a day (BID) | ORAL | Status: DC
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
## 2013-01-11 NOTE — ED Notes (Signed)
Pelvic cart set up at bedside  

## 2013-01-11 NOTE — ED Notes (Signed)
Pt states today started having abdominal pain and vomiting and denies diarrhea.  Pt states she vomited three times and feels weak.

## 2013-01-12 LAB — URINE CULTURE: Colony Count: >=100000

## 2013-01-12 LAB — GC/CHLAMYDIA PROBE AMP
CT Probe RNA: NEGATIVE
GC Probe RNA: NEGATIVE

## 2013-01-12 NOTE — ED Provider Notes (Signed)
Medical screening examination/treatment/procedure(s) were performed by non-physician practitioner and as supervising physician I was immediately available for consultation/collaboration.    Zosia Lucchese W. Bobbie Virden, MD 01/12/13 1132 

## 2013-01-13 ENCOUNTER — Telehealth (HOSPITAL_COMMUNITY): Payer: Self-pay | Admitting: Emergency Medicine

## 2013-01-13 NOTE — ED Notes (Signed)
+  Urine. Patient treated with Cipro. Sensitive to same. Per protocol MD. °

## 2013-01-13 NOTE — ED Provider Notes (Signed)
Medical screening examination/treatment/procedure(s) were performed by non-physician practitioner and as supervising physician I was immediately available for consultation/collaboration.   Shelda Jakes, MD 01/13/13 [id number]

## 2013-01-13 NOTE — ED Notes (Signed)
Patient has +Urine culture. °

## 2013-05-04 IMAGING — US US OB COMP +14 WK
2 series · 12 of 28 positions shown · non-contrast
Comparison: none

[Series 1: us ob comp +14 wk · 2 of 12 slices shown (1 of 2)]
[im 4/12]
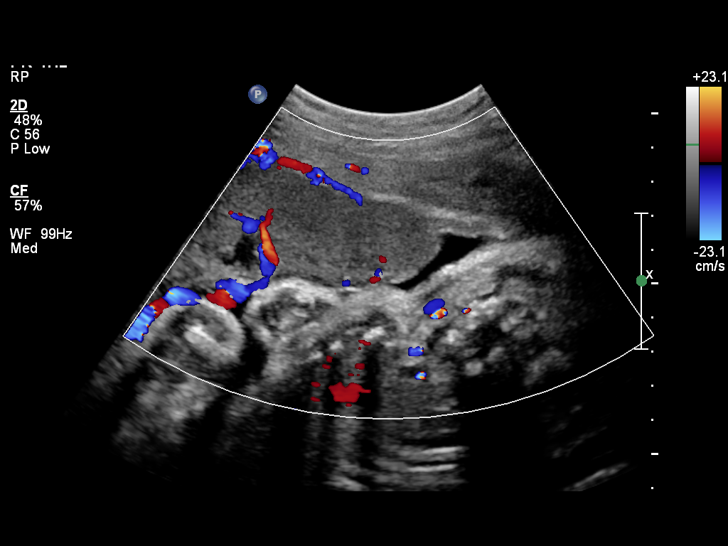
[im 12/12]
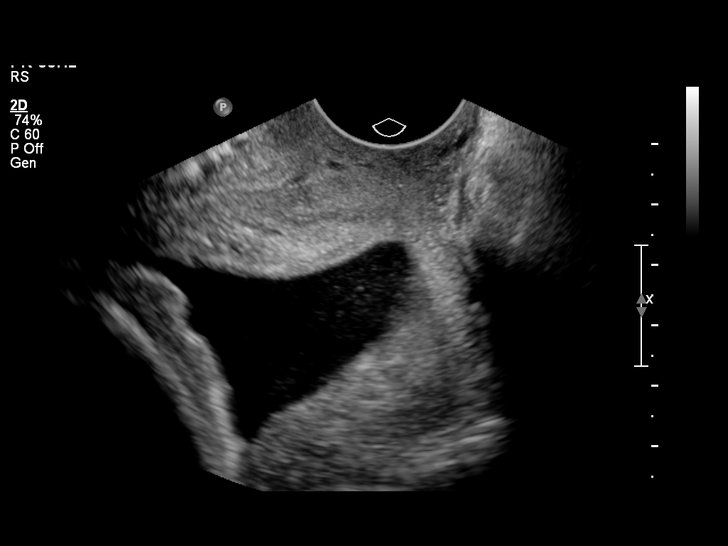

[Series 1: us ob comp +14 wk · 10 of 72 slices shown (2 of 2)]
[im 4/72]
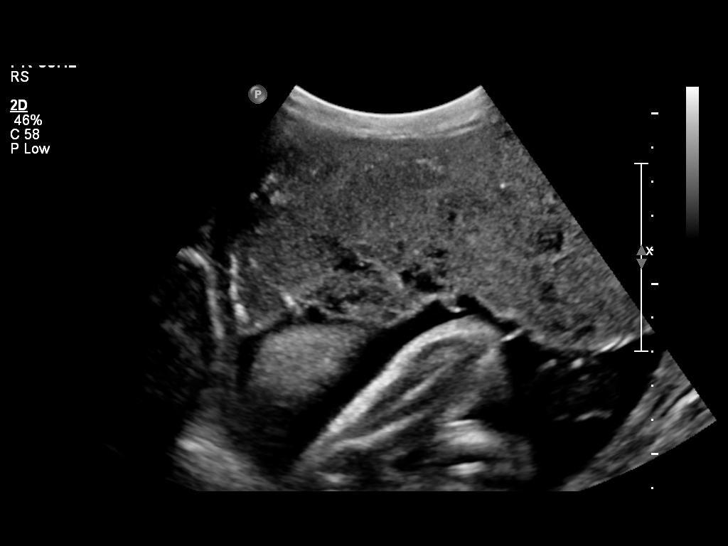
[im 13/72]
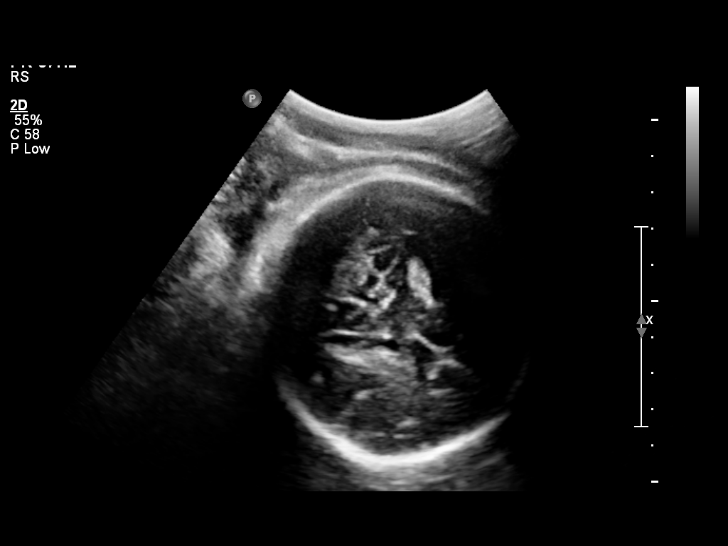
[im 19/72]
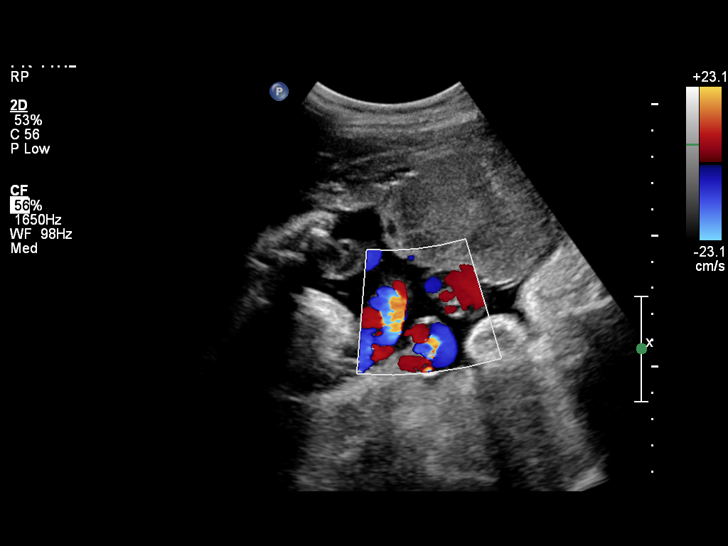
[im 25/72]
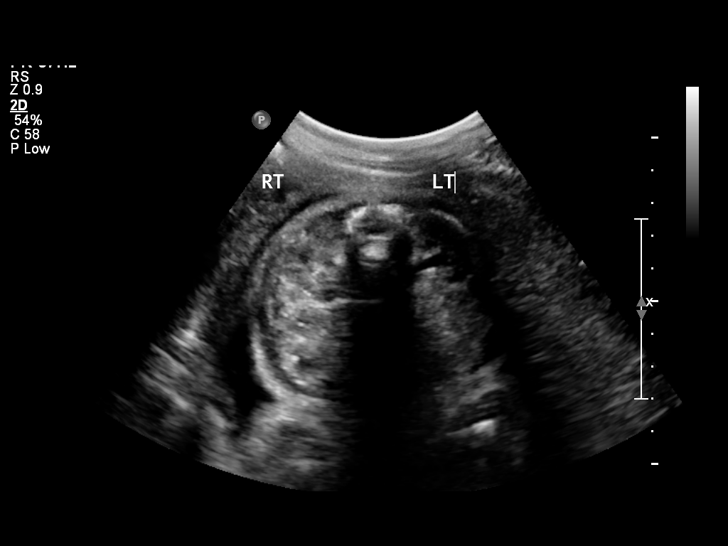
[im 34/72]
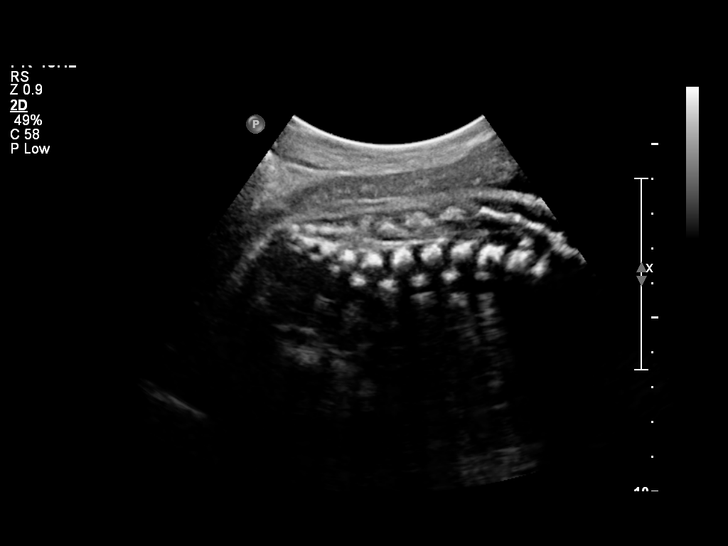
[im 41/72]
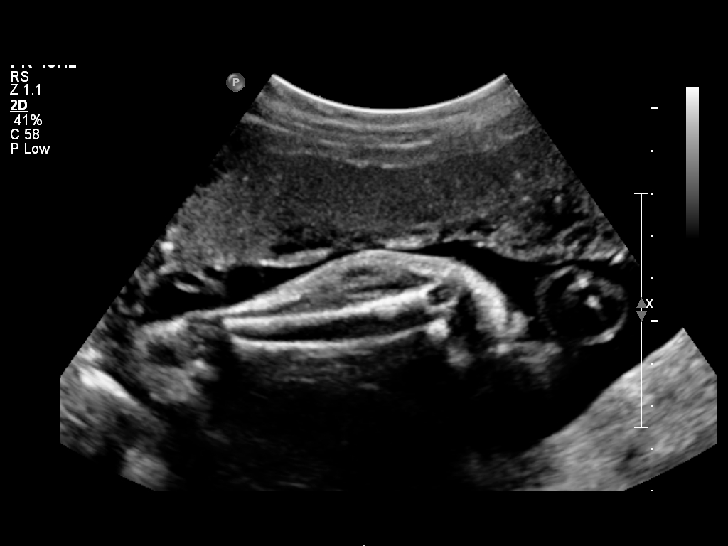
[im 47/72]
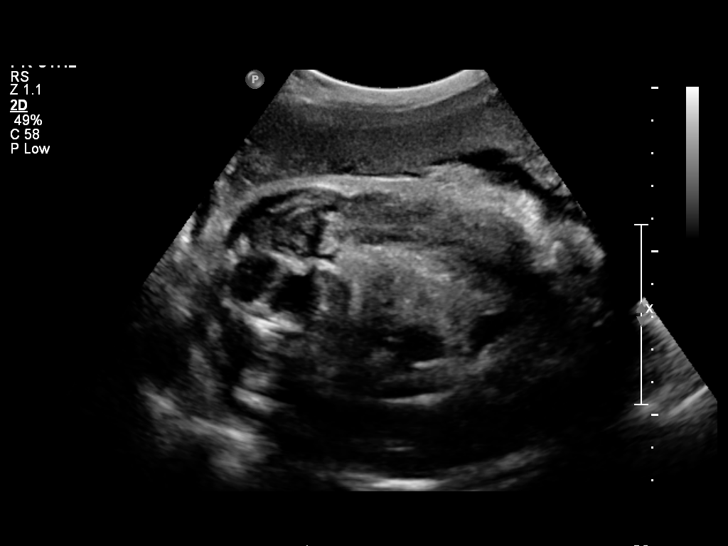
[im 56/72]
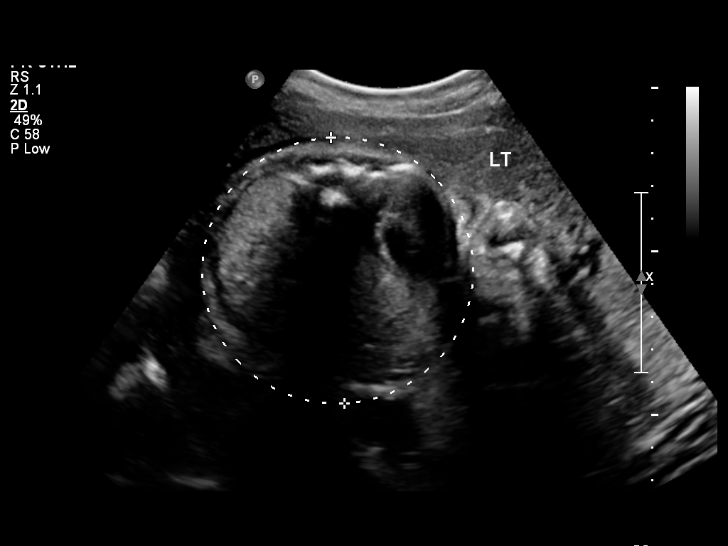
[im 62/72]
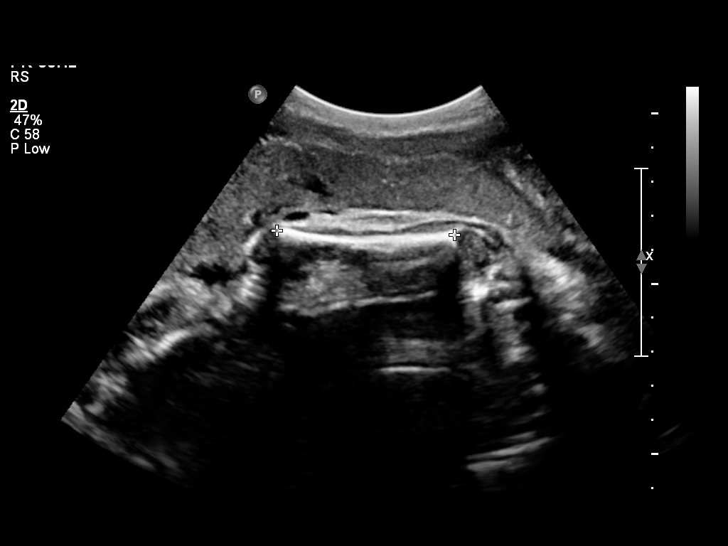
[im 68/72]
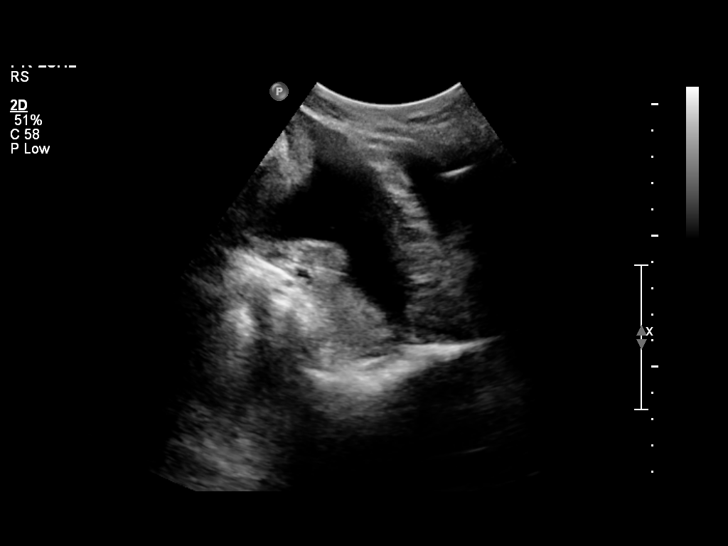

[12 of 28 positions shown; findings below may reference images not displayed]

OBSTETRICS REPORT
                    (Corrected Final 04/29/2012 [DATE])

 Order#:         [id number]
                 5_I
Procedures

 US OB COMP + 14 WK                                    76805.1
 US OB TRANSVAGINAL                                    76817.0
Indications

 Uncertain LMP;  Establish Gestational [AGE]
 Late prenatal care
 Previous c-section
Fetal Evaluation

 Preg. Location:    Intrauterine
 Fetal Heart Rate:  149                          bpm
 Cardiac Activity:  Observed
 Presentation:      Cephalic
 Placenta:          Anterior, above cervical os
 P. Cord            Visualized
 Insertion:

 Amniotic Fluid
 AFI FV:      Subjectively within normal limits
 AFI Sum:     10.94   cm       23  %Tile     Larg Pckt:    6.01  cm
 RUQ:   1.19    cm   RLQ:    6.01   cm    LUQ:   2.38    cm   LLQ:    1.36   cm
Biometry

 BPD:     72.1  mm     G. Age:  29w 0d                CI:        65.13   70 - 86
                                                      FL/HC:      19.8   19.1 -

 HC:       287  mm     G. Age:  31w 4d        7  %    HC/AC:      1.12   0.96 -

 AC:     256.1  mm     G. Age:  29w 6d      < 3  %    FL/BPD:     78.9   71 - 87
 FL:      56.9  mm     G. Age:  29w 6d      < 3  %    FL/AC:      22.2   20 - 24
 HUM:     51.7  mm     G. Age:  30w 1d       11  %

 Est. FW:    3522  gm      3 lb 4 oz     18  %
Gestational Age

 U/S Today:     30w 1d                                        EDD:   07/06/12
 Best:          32w 1d     Det. By:  Previous Ultrasound      EDD:   06/22/12
Anatomy

 Cranium:           Appears normal      Aortic Arch:       Basic anatomy
                                                           exam per order
 Fetal Cavum:       Appears normal      Ductal Arch:       Basic anatomy
                                                           exam per order
 Ventricles:        Appears normal      Diaphragm:         Appears normal
 Choroid Plexus:    Appears normal      Stomach:           Appears
                                                           normal, left
                                                           sided
 Cerebellum:        Appears normal      Abdomen:           Appears normal
 Posterior Fossa:   Appears normal      Abdominal Wall:    Not well
                                                           visualized
 Nuchal Fold:       Not applicable      Cord Vessels:      Appears normal
                    (>20 wks GA)                           (3 vessel cord)
 Face:              Appears normal      Kidneys:           Appear normal
                    (lips/profile)
 Heart:             Appears normal      Bladder:           Appears normal
                    (4 chamber &
                    axis)
 RVOT:              Appears normal      Spine:             Appears normal
 LVOT:              Not well            Limbs:             Four extremities
                    visualized                             seen

 Other:     Fetus appears to be a male. Technically difficult due to
            advanced gestational age.
Cervix Uterus Adnexa

 Cervical Length:    0        cm       Funnel Width:   1         cm
 Funnel Length:      3.6      cm

 Cervix:       Funneling of internal os noted.
 Uterus:       No abnormality visualized.

 Left Ovary:    No adnexal mass visualized.
 Right Ovary:   No adnexal mass visualized.
Impression

 Open cervix, dilated to 2cm.   The amniotic fluid volume is
 within normal limits.

 The sonographic EGA today measures 2 weeks behind the
 EGA by initial US.  The EFW is at the 18th percentile with AC
 and FL at < 3rd percentile.

 questions or concerns.
                 Attending Physician, KLEVER

## 2013-06-11 ENCOUNTER — Emergency Department (HOSPITAL_COMMUNITY)
Admission: EM | Admit: 2013-06-11 | Discharge: 2013-06-11 | Disposition: A | Discharge status: Home / Self Care | Payer: Medicaid Other | Attending: Emergency Medicine | Admitting: Emergency Medicine

## 2013-06-11 ENCOUNTER — Encounter (HOSPITAL_COMMUNITY): Payer: Self-pay | Admitting: *Deleted

## 2013-06-11 ENCOUNTER — Emergency Department (HOSPITAL_COMMUNITY): Payer: Medicaid Other

## 2013-06-11 DIAGNOSIS — R079 Chest pain, unspecified: Secondary | ICD-10-CM

## 2013-06-11 DIAGNOSIS — Z87440 Personal history of urinary (tract) infections: Secondary | ICD-10-CM | POA: Insufficient documentation

## 2013-06-11 DIAGNOSIS — R0789 Other chest pain: Secondary | ICD-10-CM | POA: Insufficient documentation

## 2013-06-11 DIAGNOSIS — Z792 Long term (current) use of antibiotics: Secondary | ICD-10-CM | POA: Insufficient documentation

## 2013-06-11 DIAGNOSIS — Z8611 Personal history of tuberculosis: Secondary | ICD-10-CM | POA: Insufficient documentation

## 2013-06-11 LAB — POCT I-STAT, CHEM 8
BUN: 7 mg/dL (ref 6–23)
Calcium, Ion: 1.24 mmol/L — ABNORMAL HIGH (ref 1.12–1.23)
Chloride: 106 mEq/L (ref 96–112)
Creatinine, Ser: 0.7 mg/dL (ref 0.50–1.10)
Potassium: 3.8 mEq/L (ref 3.5–5.1)
Sodium: 138 mEq/L (ref 135–145)
TCO2: 21 mmol/L (ref 0–100)

## 2013-06-11 LAB — CBC
Hemoglobin: 12.6 g/dL (ref 12.0–15.0)
MCH: 31.3 pg (ref 26.0–34.0)
RBC: 4.02 MIL/uL (ref 3.87–5.11)

## 2013-06-11 LAB — POCT I-STAT TROPONIN I: Troponin i, poc: 0 ng/mL (ref 0.00–0.08)

## 2013-06-11 MED ORDER — OMEPRAZOLE 20 MG PO CPDR: 20.0000 mg | DELAYED_RELEASE_CAPSULE | Freq: Every day | ORAL | Status: DC

## 2013-06-11 NOTE — ED Provider Notes (Signed)
Medical screening examination/treatment/procedure(s) were performed by non-physician practitioner and as supervising physician I was immediately available for consultation/collaboration.  Doug Sou, MD 06/11/13 2137

## 2013-06-11 NOTE — ED Notes (Signed)
Pt c/o L sided chest pain (sometimes R), she describes as stabbing x 1 month.  Pain has gotten worse today.  Not accompanied with diaphoresis, nausea, though c/o some intermittent sob.
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
## 2013-07-26 ENCOUNTER — Encounter (HOSPITAL_COMMUNITY): Payer: Self-pay | Admitting: Emergency Medicine

## 2013-07-26 ENCOUNTER — Emergency Department (HOSPITAL_COMMUNITY)
Admission: EM | Admit: 2013-07-26 | Discharge: 2013-07-26 | Disposition: A | Discharge status: Home / Self Care | Payer: Medicaid Other | Attending: Emergency Medicine | Admitting: Emergency Medicine

## 2013-07-26 DIAGNOSIS — Z3490 Encounter for supervision of normal pregnancy, unspecified, unspecified trimester: Secondary | ICD-10-CM

## 2013-07-26 DIAGNOSIS — Z8611 Personal history of tuberculosis: Secondary | ICD-10-CM | POA: Insufficient documentation

## 2013-07-26 DIAGNOSIS — Z87440 Personal history of urinary (tract) infections: Secondary | ICD-10-CM | POA: Insufficient documentation

## 2013-07-26 DIAGNOSIS — B373 Candidiasis of vulva and vagina: Secondary | ICD-10-CM | POA: Insufficient documentation

## 2013-07-26 DIAGNOSIS — R109 Unspecified abdominal pain: Secondary | ICD-10-CM | POA: Insufficient documentation

## 2013-07-26 DIAGNOSIS — B3731 Acute candidiasis of vulva and vagina: Secondary | ICD-10-CM | POA: Insufficient documentation

## 2013-07-26 DIAGNOSIS — O210 Mild hyperemesis gravidarum: Secondary | ICD-10-CM | POA: Insufficient documentation

## 2013-07-26 LAB — URINALYSIS, ROUTINE W REFLEX MICROSCOPIC
Hgb urine dipstick: NEGATIVE
Leukocytes, UA: NEGATIVE
Nitrite: NEGATIVE
Protein, ur: NEGATIVE mg/dL
Specific Gravity, Urine: 1.02 (ref 1.005–1.030)
Urobilinogen, UA: 1 mg/dL (ref 0.0–1.0)
pH: 8 (ref 5.0–8.0)

## 2013-07-26 LAB — POCT PREGNANCY, URINE: Preg Test, Ur: POSITIVE — AB

## 2013-07-26 LAB — OB RESULTS CONSOLE GC/CHLAMYDIA
Chlamydia: NEGATIVE
Gonorrhea: NEGATIVE

## 2013-07-26 LAB — WET PREP, GENITAL: Clue Cells Wet Prep HPF POC: NONE SEEN

## 2013-07-26 MED ORDER — PRENATAL COMPLETE 14-0.4 MG PO TABS: 1.0000 | ORAL_TABLET | Freq: Every day | ORAL | Status: DC

## 2013-07-26 MED ORDER — FLUCONAZOLE 100 MG PO TABS
150.0000 mg | ORAL_TABLET | Freq: Once | ORAL | Status: AC
  Administered 2013-07-26: 150 mg via ORAL
  Filled 2013-07-26: qty 2

## 2013-07-26 NOTE — ED Provider Notes (Signed)
Medical screening examination/treatment/procedure(s) were performed by non-physician practitioner and as supervising physician I was immediately available for consultation/collaboration.  EKG Interpretation   None         Semya Klinke H Jahleel Stroschein, MD 07/26/13 2245 
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
## 2013-07-26 NOTE — ED Notes (Addendum)
Pt is here today to check for preg no pain she states LMP 8/24 denies vag bvleeding

## 2013-07-27 LAB — GC/CHLAMYDIA PROBE AMP
CT Probe RNA: NEGATIVE
GC Probe RNA: NEGATIVE

## 2013-09-23 NOTE — L&D Delivery Note (Signed)
2045: Patient called out reporting increase in rectal pressure that remained despite contractions.  Patient checked and fetus now +2 station.  Patient instructed on pushing techniques and delivered as below.   Delivery Note At 9:00 PM a healthy female "Gabriela Benton" was delivered via VBAC, Spontaneous (Presentation: Right Occiput Anterior with a Compound Right Hand ).  APGAR: 9, 9; weight 6 lb 13.4 oz (3100 g).  Placenta status: Intact, Expelled Spontaneously via Duncan Mechanism.  Cord: 3 vessels with the following complications: None.  Cord pH: N/A  Anesthesia: Epidural  Episiotomy: None Lacerations: None Suture Repair: N/a Est. Blood Loss (mL): 400  Mom to postpartum.  Baby to Couplet care / Skin to Skin. Mom desires nexplanon birth control.  Gerrit Heck, CNM, MSN 02/18/2014, 10:36 PM

## 2013-10-12 LAB — OB RESULTS CONSOLE RPR: RPR: NONREACTIVE

## 2014-02-04 ENCOUNTER — Encounter (HOSPITAL_COMMUNITY): Payer: Self-pay | Admitting: Family

## 2014-02-04 ENCOUNTER — Inpatient Hospital Stay (HOSPITAL_COMMUNITY): Payer: Medicaid Other

## 2014-02-04 ENCOUNTER — Inpatient Hospital Stay (HOSPITAL_COMMUNITY)
Admission: AD | Admit: 2014-02-04 | Discharge: 2014-02-04 | Disposition: A | Discharge status: Home / Self Care | Payer: Medicaid Other | Source: Ambulatory Visit | Attending: Obstetrics and Gynecology | Admitting: Obstetrics and Gynecology

## 2014-02-04 DIAGNOSIS — O368190 Decreased fetal movements, unspecified trimester, not applicable or unspecified: Secondary | ICD-10-CM | POA: Insufficient documentation

## 2014-02-04 DIAGNOSIS — M79609 Pain in unspecified limb: Secondary | ICD-10-CM

## 2014-02-04 DIAGNOSIS — O99891 Other specified diseases and conditions complicating pregnancy: Secondary | ICD-10-CM | POA: Insufficient documentation

## 2014-02-04 DIAGNOSIS — O09899 Supervision of other high risk pregnancies, unspecified trimester: Secondary | ICD-10-CM

## 2014-02-04 DIAGNOSIS — O4590 Premature separation of placenta, unspecified, unspecified trimester: Secondary | ICD-10-CM

## 2014-02-04 DIAGNOSIS — Z98891 History of uterine scar from previous surgery: Secondary | ICD-10-CM

## 2014-02-04 DIAGNOSIS — Z331 Pregnant state, incidental: Secondary | ICD-10-CM

## 2014-02-04 DIAGNOSIS — O9989 Other specified diseases and conditions complicating pregnancy, childbirth and the puerperium: Principal | ICD-10-CM

## 2014-02-04 DIAGNOSIS — O0930 Supervision of pregnancy with insufficient antenatal care, unspecified trimester: Secondary | ICD-10-CM

## 2014-02-04 DIAGNOSIS — O34219 Maternal care for unspecified type scar from previous cesarean delivery: Secondary | ICD-10-CM

## 2014-02-04 LAB — OB RESULTS CONSOLE GBS: GBS: NEGATIVE

## 2014-02-04 MED ORDER — CYCLOBENZAPRINE HCL 10 MG PO TABS: 10.0000 mg | ORAL_TABLET | Freq: Three times a day (TID) | ORAL | Status: DC | PRN

## 2014-02-04 MED ORDER — CYCLOBENZAPRINE HCL 10 MG PO TABS
10.0000 mg | ORAL_TABLET | Freq: Three times a day (TID) | ORAL | Status: DC | PRN
  Administered 2014-02-04: 10 mg via ORAL
  Filled 2014-02-04: qty 1

## 2014-02-04 MED ORDER — CYCLOBENZAPRINE HCL 5 MG PO TABS: 5.0000 mg | ORAL_TABLET | Freq: Three times a day (TID) | ORAL | Status: DC
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
## 2014-02-04 NOTE — MAU Note (Addendum)
Called [facility] - order receipt verified by Noreene LarssonJill.

## 2014-02-04 NOTE — Progress Notes (Signed)
*  Preliminary Results* Left lower extremity venous duplex completed. Left lower extremity is negative for deep vein thrombosis. There is no evidence of left Baker's cyst.  02/04/2014 4:18 PM  Gertie FeyMichelle Ivannah Zody, RVT, RDCS, RDMS

## 2014-02-04 NOTE — Discharge Instructions (Signed)
Take Flexeril 3 times per day as needed for leg pain Rest Drink plenty of water  Braxton Hicks Contractions Pregnancy is commonly associated with contractions of the uterus throughout the pregnancy. Towards the end of pregnancy (32 to 34 weeks), these contractions [facility](Braxton Gabriela Benton) can develop more often and may become more forceful. This is not true labor because these contractions do not result in opening (dilatation) and thinning of the cervix. They are sometimes difficult to tell apart from true labor because these contractions can be forceful and people have different pain tolerances. You should not feel embarrassed if you go to the hospital with false labor. Sometimes, the only way to tell if you are in true labor is for your caregiver to follow the changes in the cervix. How to tell the difference between true and false labor:  False labor.  The contractions of false labor are usually shorter, irregular and not as hard as those of true labor.  They are often felt in the front of the lower abdomen and in the groin.  They may leave with walking around or changing positions while lying down.  They get weaker and are shorter lasting as time goes on.  These contractions are usually irregular.  They do not usually become progressively stronger, regular and closer together as with true labor.  True labor.  Contractions in true labor last 30 to 70 seconds, become very regular, usually become more intense, and increase in frequency.  They do not go away with walking.  The discomfort is usually felt in the top of the uterus and spreads to the lower abdomen and low back.  True labor can be determined by your caregiver with an exam. This will show that the cervix is dilating and getting thinner. If there are no prenatal problems or other health problems associated with the pregnancy, it is completely safe to be sent home with false labor and await the onset of true labor. HOME CARE  INSTRUCTIONS   Keep up with your usual exercises and instructions.  Take medications as directed.  Keep your regular prenatal appointment.  Eat and drink lightly if you think you are going into labor.  If BH contractions are making you uncomfortable:  Change your activity position from lying down or resting to walking/walking to resting.  Sit and rest in a tub of warm water.  Drink 2 to 3 glasses of water. Dehydration may cause B-H contractions.  Do slow and deep breathing several times an hour. SEEK IMMEDIATE MEDICAL CARE IF:   Your contractions continue to become stronger, more regular, and closer together.  You have a gushing, burst or leaking of fluid from the vagina.  An oral temperature above 102 F (38.9 C) develops.  You have passage of blood-tinged mucus.  You develop vaginal bleeding.  You develop continuous belly (abdominal) pain.  You have low back pain that you never had before.  You feel the baby's head pushing down causing pelvic pressure.  The baby is not moving as much as it used to. Document Released: 09/09/2005 Document Revised: 12/02/2011 Document Reviewed: 06/21/2013 [facility] Patient Information 2014 [employer], [place].

## 2014-02-04 NOTE — MAU Note (Signed)
23 yo, G4P3 at 3226w6d, presents to MAU with c/o decreased FM and intermittent contractions since 2200 yesterday. She reports pain radiating down L leg. Denies VB, LOF.

## 2014-02-15 ENCOUNTER — Encounter: Payer: Self-pay | Admitting: *Deleted

## 2014-02-17 LAB — OB RESULTS CONSOLE HIV ANTIBODY (ROUTINE TESTING): HIV: NONREACTIVE

## 2014-02-18 ENCOUNTER — Encounter (HOSPITAL_COMMUNITY): Payer: Medicaid Other | Admitting: Anesthesiology

## 2014-02-18 ENCOUNTER — Encounter (HOSPITAL_COMMUNITY): Payer: Self-pay | Admitting: *Deleted

## 2014-02-18 ENCOUNTER — Inpatient Hospital Stay (HOSPITAL_COMMUNITY): Payer: Medicaid Other | Admitting: Anesthesiology

## 2014-02-18 ENCOUNTER — Inpatient Hospital Stay (HOSPITAL_COMMUNITY)
Admission: AD | Admit: 2014-02-18 | Discharge: 2014-02-20 | DRG: 775 | Disposition: A | Discharge status: Home / Self Care | Payer: Medicaid Other | Source: Ambulatory Visit | Attending: Obstetrics and Gynecology | Admitting: Obstetrics and Gynecology

## 2014-02-18 DIAGNOSIS — O09899 Supervision of other high risk pregnancies, unspecified trimester: Secondary | ICD-10-CM

## 2014-02-18 DIAGNOSIS — O9989 Other specified diseases and conditions complicating pregnancy, childbirth and the puerperium: Secondary | ICD-10-CM

## 2014-02-18 DIAGNOSIS — O34219 Maternal care for unspecified type scar from previous cesarean delivery: Secondary | ICD-10-CM | POA: Diagnosis present

## 2014-02-18 DIAGNOSIS — O9903 Anemia complicating the puerperium: Secondary | ICD-10-CM | POA: Diagnosis not present

## 2014-02-18 DIAGNOSIS — O09219 Supervision of pregnancy with history of pre-term labor, unspecified trimester: Secondary | ICD-10-CM

## 2014-02-18 DIAGNOSIS — O99892 Other specified diseases and conditions complicating childbirth: Secondary | ICD-10-CM | POA: Diagnosis present

## 2014-02-18 DIAGNOSIS — D649 Anemia, unspecified: Secondary | ICD-10-CM | POA: Diagnosis not present

## 2014-02-18 DIAGNOSIS — Z22330 Carrier of Group B streptococcus: Secondary | ICD-10-CM

## 2014-02-18 HISTORY — DX: Supervision of other high risk pregnancies, unspecified trimester: O09.899

## 2014-02-18 LAB — TYPE AND SCREEN
ABO/RH(D): O POS
Antibody Screen: NEGATIVE

## 2014-02-18 LAB — RPR

## 2014-02-18 LAB — CBC
HCT: 36.6 % (ref 36.0–46.0)
Hemoglobin: 13.4 g/dL (ref 12.0–15.0)
MCH: 33.3 pg (ref 26.0–34.0)
MCHC: 36.6 g/dL — ABNORMAL HIGH (ref 30.0–36.0)
MCV: 90.8 fL (ref 78.0–100.0)
Platelets: 206 10*3/uL (ref 150–400)
RBC: 4.03 MIL/uL (ref 3.87–5.11)
RDW: 13.4 % (ref 11.5–15.5)
WBC: 15.3 10*3/uL — AB (ref 4.0–10.5)

## 2014-02-18 LAB — OB RESULTS CONSOLE HEPATITIS B SURFACE ANTIGEN: Hepatitis B Surface Ag: NEGATIVE

## 2014-02-18 LAB — OB RESULTS CONSOLE RUBELLA ANTIBODY, IGM: Rubella: IMMUNE

## 2014-02-18 MED ORDER — LIDOCAINE HCL (PF) 1 % IJ SOLN
30.0000 mL | INTRAMUSCULAR | Status: DC | PRN
  Filled 2014-02-18: qty 30

## 2014-02-18 MED ORDER — FENTANYL CITRATE 0.05 MG/ML IJ SOLN: 100.0000 ug | INTRAMUSCULAR | Status: DC | PRN

## 2014-02-18 MED ORDER — EPHEDRINE 5 MG/ML INJ
10.0000 mg | INTRAVENOUS | Status: DC | PRN
  Filled 2014-02-18: qty 2

## 2014-02-18 MED ORDER — ACETAMINOPHEN 325 MG PO TABS: 650.0000 mg | ORAL_TABLET | ORAL | Status: DC | PRN

## 2014-02-18 MED ORDER — FLEET ENEMA 7-19 GM/118ML RE ENEM: 1.0000 | ENEMA | RECTAL | Status: DC | PRN

## 2014-02-18 MED ORDER — LACTATED RINGERS IV SOLN: 500.0000 mL | INTRAVENOUS | Status: DC | PRN

## 2014-02-18 MED ORDER — ONDANSETRON HCL 4 MG PO TABS: 4.0000 mg | ORAL_TABLET | ORAL | Status: DC | PRN

## 2014-02-18 MED ORDER — SIMETHICONE 80 MG PO CHEW: 80.0000 mg | CHEWABLE_TABLET | ORAL | Status: DC | PRN

## 2014-02-18 MED ORDER — DIPHENHYDRAMINE HCL 25 MG PO CAPS: 25.0000 mg | ORAL_CAPSULE | Freq: Four times a day (QID) | ORAL | Status: DC | PRN

## 2014-02-18 MED ORDER — OXYTOCIN 40 UNITS IN LACTATED RINGERS INFUSION - SIMPLE MED
1.0000 m[IU]/min | INTRAVENOUS | Status: DC
  Administered 2014-02-18: 1 m[IU]/min via INTRAVENOUS
  Administered 2014-02-18: 2 m[IU]/min via INTRAVENOUS

## 2014-02-18 MED ORDER — LACTATED RINGERS IV SOLN
INTRAVENOUS | Status: DC
  Administered 2014-02-18 (×2): via INTRAVENOUS

## 2014-02-18 MED ORDER — TETANUS-DIPHTH-ACELL PERTUSSIS 5-2.5-18.5 LF-MCG/0.5 IM SUSP
0.5000 mL | Freq: Once | INTRAMUSCULAR | Status: AC
  Administered 2014-02-19: 0.5 mL via INTRAMUSCULAR
  Filled 2014-02-18: qty 0.5

## 2014-02-18 MED ORDER — OXYCODONE-ACETAMINOPHEN 5-325 MG PO TABS: 1.0000 | ORAL_TABLET | ORAL | Status: DC | PRN

## 2014-02-18 MED ORDER — PENICILLIN G POTASSIUM 5000000 UNITS IJ SOLR
2.5000 10*6.[IU] | INTRAVENOUS | Status: DC
  Administered 2014-02-18: 2.5 10*6.[IU] via INTRAVENOUS
  Filled 2014-02-18 (×5): qty 2.5

## 2014-02-18 MED ORDER — LANOLIN HYDROUS EX OINT: TOPICAL_OINTMENT | CUTANEOUS | Status: DC | PRN

## 2014-02-18 MED ORDER — WITCH HAZEL-GLYCERIN EX PADS
1.0000 "application " | MEDICATED_PAD | CUTANEOUS | Status: DC | PRN
  Administered 2014-02-20: 1 via TOPICAL

## 2014-02-18 MED ORDER — ZOLPIDEM TARTRATE 5 MG PO TABS: 5.0000 mg | ORAL_TABLET | Freq: Every evening | ORAL | Status: DC | PRN

## 2014-02-18 MED ORDER — SENNOSIDES-DOCUSATE SODIUM 8.6-50 MG PO TABS
2.0000 | ORAL_TABLET | ORAL | Status: DC
  Administered 2014-02-19 (×2): 2 via ORAL
  Filled 2014-02-18 (×2): qty 2

## 2014-02-18 MED ORDER — MISOPROSTOL 25 MCG QUARTER TABLET
25.0000 ug | ORAL_TABLET | ORAL | Status: DC | PRN
  Filled 2014-02-18: qty 1

## 2014-02-18 MED ORDER — OXYTOCIN 40 UNITS IN LACTATED RINGERS INFUSION - SIMPLE MED
62.5000 mL/h | INTRAVENOUS | Status: DC
  Filled 2014-02-18: qty 1000

## 2014-02-18 MED ORDER — TERBUTALINE SULFATE 1 MG/ML IJ SOLN: 0.2500 mg | Freq: Once | INTRAMUSCULAR | Status: DC | PRN

## 2014-02-18 MED ORDER — OXYCODONE-ACETAMINOPHEN 5-325 MG PO TABS
1.0000 | ORAL_TABLET | ORAL | Status: DC | PRN
  Administered 2014-02-19 (×2): 2 via ORAL
  Administered 2014-02-20 (×3): 1 via ORAL
  Filled 2014-02-18: qty 1
  Filled 2014-02-18 (×2): qty 2
  Filled 2014-02-18 (×2): qty 1

## 2014-02-18 MED ORDER — FENTANYL 2.5 MCG/ML BUPIVACAINE 1/10 % EPIDURAL INFUSION (WH - ANES)
INTRAMUSCULAR | Status: AC
  Filled 2014-02-18: qty 125

## 2014-02-18 MED ORDER — BUTORPHANOL TARTRATE 1 MG/ML IJ SOLN
1.0000 mg | INTRAMUSCULAR | Status: DC | PRN
  Administered 2014-02-18 (×3): 1 mg via INTRAVENOUS
  Filled 2014-02-18 (×3): qty 1

## 2014-02-18 MED ORDER — PHENYLEPHRINE 40 MCG/ML (10ML) SYRINGE FOR IV PUSH (FOR BLOOD PRESSURE SUPPORT)
PREFILLED_SYRINGE | INTRAVENOUS | Status: AC
  Filled 2014-02-18: qty 10

## 2014-02-18 MED ORDER — ONDANSETRON HCL 4 MG/2ML IJ SOLN: 4.0000 mg | INTRAMUSCULAR | Status: DC | PRN

## 2014-02-18 MED ORDER — DIBUCAINE 1 % RE OINT: 1.0000 "application " | TOPICAL_OINTMENT | RECTAL | Status: DC | PRN

## 2014-02-18 MED ORDER — PRENATAL MULTIVITAMIN CH
1.0000 | ORAL_TABLET | Freq: Every day | ORAL | Status: DC
  Administered 2014-02-19 – 2014-02-20 (×2): 1 via ORAL
  Filled 2014-02-18 (×2): qty 1

## 2014-02-18 MED ORDER — IBUPROFEN 600 MG PO TABS
600.0000 mg | ORAL_TABLET | Freq: Four times a day (QID) | ORAL | Status: DC | PRN
  Administered 2014-02-18: 600 mg via ORAL
  Filled 2014-02-18: qty 1

## 2014-02-18 MED ORDER — FENTANYL 2.5 MCG/ML BUPIVACAINE 1/10 % EPIDURAL INFUSION (WH - ANES)
14.0000 mL/h | INTRAMUSCULAR | Status: DC | PRN
  Administered 2014-02-18: 14 mL/h via EPIDURAL

## 2014-02-18 MED ORDER — PHENYLEPHRINE 40 MCG/ML (10ML) SYRINGE FOR IV PUSH (FOR BLOOD PRESSURE SUPPORT)
80.0000 ug | PREFILLED_SYRINGE | INTRAVENOUS | Status: DC | PRN
  Filled 2014-02-18: qty 2

## 2014-02-18 MED ORDER — LIDOCAINE HCL (PF) 1 % IJ SOLN
INTRAMUSCULAR | Status: DC | PRN
  Administered 2014-02-18 (×2): 5 mL

## 2014-02-18 MED ORDER — ONDANSETRON HCL 4 MG/2ML IJ SOLN
4.0000 mg | Freq: Four times a day (QID) | INTRAMUSCULAR | Status: DC | PRN
  Administered 2014-02-18: 4 mg via INTRAVENOUS
  Filled 2014-02-18: qty 2

## 2014-02-18 MED ORDER — PENICILLIN G POTASSIUM 5000000 UNITS IJ SOLR
5.0000 10*6.[IU] | Freq: Once | INTRAVENOUS | Status: AC
  Administered 2014-02-18: 5 10*6.[IU] via INTRAVENOUS
  Filled 2014-02-18: qty 5

## 2014-02-18 MED ORDER — DIPHENHYDRAMINE HCL 50 MG/ML IJ SOLN: 12.5000 mg | INTRAMUSCULAR | Status: DC | PRN

## 2014-02-18 MED ORDER — EPHEDRINE 5 MG/ML INJ
INTRAVENOUS | Status: AC
  Filled 2014-02-18: qty 4

## 2014-02-18 MED ORDER — OXYTOCIN BOLUS FROM INFUSION: 500.0000 mL | INTRAVENOUS | Status: DC

## 2014-02-18 MED ORDER — BENZOCAINE-MENTHOL 20-0.5 % EX AERO
1.0000 "application " | INHALATION_SPRAY | CUTANEOUS | Status: DC | PRN
  Administered 2014-02-20: 1 via TOPICAL
  Filled 2014-02-18: qty 56

## 2014-02-18 MED ORDER — CITRIC ACID-SODIUM CITRATE 334-500 MG/5ML PO SOLN
30.0000 mL | ORAL | Status: DC | PRN
Start: 2014-02-18 — End: 2014-02-18

## 2014-02-18 MED ORDER — IBUPROFEN 600 MG PO TABS
600.0000 mg | ORAL_TABLET | Freq: Four times a day (QID) | ORAL | Status: DC
  Administered 2014-02-19 – 2014-02-20 (×6): 600 mg via ORAL
  Filled 2014-02-18 (×6): qty 1

## 2014-02-18 MED ORDER — LACTATED RINGERS IV SOLN
500.0000 mL | Freq: Once | INTRAVENOUS | Status: AC
  Administered 2014-02-18: 16:00:00 via INTRAVENOUS

## 2014-02-18 NOTE — Anesthesia Procedure Notes (Signed)
Epidural Patient location during procedure: OB Start time: 02/18/2014 4:33 PM  Staffing Anesthesiologist: Brayton Caves Performed by: anesthesiologist   Preanesthetic Checklist Completed: patient identified, site marked, surgical consent, pre-op evaluation, timeout performed, IV checked, risks and benefits discussed and monitors and equipment checked  Epidural Patient position: sitting Prep: site prepped and draped and DuraPrep Patient monitoring: continuous pulse ox and blood pressure Approach: midline Location: L3-L4 Injection technique: LOR air  Needle:  Needle type: Tuohy  Needle gauge: 17 G Needle length: 9 cm and 9 Needle insertion depth: 5 cm cm Catheter type: closed end flexible Catheter size: 19 Gauge Catheter at skin depth: 10 cm Test dose: negative  Assessment Events: blood not aspirated, injection not painful, no injection resistance, negative IV test and no paresthesia  Additional Notes Patient identified.  Risk benefits discussed including failed block, incomplete pain control, headache, nerve damage, paralysis, blood pressure changes, nausea, vomiting, reactions to medication both toxic or allergic, and postpartum back pain.  Patient expressed understanding and wished to proceed.  All questions were answered.  Sterile technique used throughout procedure and epidural site dressed with sterile barrier dressing. No paresthesia or other complications noted.The patient did not experience any signs of intravascular injection such as tinnitus or metallic taste in mouth nor signs of intrathecal spread such as rapid motor block. Please see nursing notes for vital signs.

## 2014-02-18 NOTE — Progress Notes (Signed)
  Subjective: Very uncomfortable, crying out--difficult to assess specific cause of pain due to patient not communicating with me regarding what she is feeling.  Per patient, "want a C/S", but her mother at bedside encouraging patient not to have a C/S. Last dose IV pain med at 1459 with minimal benefit.  Removing external monitor pieces due to discomfort.  Objective: BP 97/74  Pulse 99  Temp(Src) 98 F (36.7 C) (Oral)  Resp 18  Ht 4\' 10"  (1.473 m)  Wt 134 lb (60.782 kg)  BMI 28.01 kg/m2  LMP 05/17/2013    Had started pitocin augmentation at 1509, but patient was crying before pitocin started.  I stopped pitocin at 1540 due to difficulty assessing etiology of pain.  Uterus soft between UCs, NT. No bleeding.  FHT: Category 1 UC:   irregular, every 3-5 minutes SVE:   Dilation: 7 Effacement (%): 100 Station: -1 Exam by:: vlatham,cnm FSE applied due to difficulty tracing baby with patient moving in bed and sitting straight up for comfort  Assessment:  Arrest of labor, likely due to inadequate contractions Pain management issue GBS negative  Plan: Recommended patient consider epidural to maximize comfort. Advised patient I would recommend epidural, then restarting pitocin to facilitate adequate labor before patient makes decision for cesarean.  Patient agreeable with plan. Will monitor maternal/fetal status closely--anticipate placing IUPC with next exam after epidural placed.  Nigel Bridgeman CNM, MN 02/18/2014, 4:09 PM
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
## 2014-02-18 NOTE — Progress Notes (Signed)
  Subjective: Resting between UCs, mother at bedside.    Objective: BP 114/70  Pulse 100  Temp(Src) 97.1 F (36.2 C) (Oral)  Resp 20  Ht 4\' 10"  (1.473 m)  Wt 134 lb (60.782 kg)  BMI 28.01 kg/m2  LMP 05/17/2013      FHT: Category 1 UC:   regular, every 5-8 minutes SVE:   Dilation: 7 Effacement (%): 80 Station: -2 Exam by:: vlatyham,cn BBOW--AROM, clear fluid   Assessment:  Progressive labor GBS positive Previous C/S, prior VBAC x 2, VBAC this labor  Plan: Continue current care. IV pain med per patient request.  Nigel Bridgeman CNM, MS 02/18/2014, 11:42 AM

## 2014-02-18 NOTE — Progress Notes (Signed)
  Subjective: Requesting VE and pain med.  Has received 2 previous doses of Stadol (0824, 1127) with benefit.  Mother at bedside.  Review of prenatal records shows GBS negative from 5/5 and negative urine culture from 10/12/13.  EPIC banner listed GBS as positive, but there is no documentation of this result either in EPIC or [place].  Objective: BP 118/79  Pulse 97  Temp(Src) 98.2 F (36.8 C) (Oral)  Resp 20  Ht 4\' 10"  (1.473 m)  Wt 134 lb (60.782 kg)  BMI 28.01 kg/m2  LMP 05/17/2013      FHT: Category 1 UC:   irregular, every 4-7 minutes, 2 min in duration SVE:   Dilation: 7 Effacement (%): 100 Station: -1 Exam by:: vlatham,cnm  Assessment:  Inadequate labor Previous C/S, hx 2 prior VBACs GBS negative--corrected in H&P and progress notes.    Plan: Consulted with Dr. Normand Sloop Pitocin augmentation per low dose protocol. IV pain med prn.  Nigel Bridgeman CNM, MS 02/18/2014, 3:00 PM
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
## 2014-02-18 NOTE — MAU Note (Signed)
Spoke with Gabriela Benton CNM to evaluate FHT. Pt may ambulate in hall for 1 hour.

## 2014-02-18 NOTE — Progress Notes (Signed)
  Subjective: Sleeping s/p Stadol dose--husband and mother at bedside.  Objective: BP 116/85  Pulse 102  Temp(Src) 98.1 F (36.7 C) (Oral)  Resp 20  Ht 4\' 10"  (1.473 m)  Wt 134 lb (60.782 kg)  BMI 28.01 kg/m2  LMP 05/17/2013    1st dose PCN infusing  FHT: Category 1 UC:   regular, every 6-8 minutes SVE:   Deferred at present  Assessment:  Early labor Previous C/S, VBAC x 2, VBAC planned today GBS positive  Plan: Await completion of IV ATB, then plan AROM.  Nigel Bridgeman CNM, MN 02/18/2014, 10:05 AM

## 2014-02-18 NOTE — Progress Notes (Signed)
Subjective Comfortable with epidural.  Objective: BP 138/85  Pulse 92  Temp(Src) 98 F (36.7 C) (Oral)  Resp 18  Ht 4\' 10"  (1.473 m)  Wt 134 lb (60.782 kg)  BMI 28.01 kg/m2  SpO2 100%  LMP 05/17/2013  Breastfeeding? Unknown   Total I/O In: -  Out: 400 [Blood:400]  FHT: Category 1 UC:   regular, every 3 minutes SVE:   8 cm, cervix slightly edematous, but vtx lower in pelvis IUPC and FSE on Foley inserted  Assessment:  ? Arrest of active phase ? Labor adequacy Previous C/S, with subsequent VBAC x 2   Plan: Observe adequacy of contractions, augment prn.  Nigel Bridgeman CNM, MS 02/18/2014, 6:30p

## 2014-02-18 NOTE — Anesthesia Preprocedure Evaluation (Signed)

## 2014-02-18 NOTE — Progress Notes (Signed)
  Subjective: Patient comfortable with epidural.  Reports some discomfort with contractions, but no rectal pressure. Mother at bedside.  Objective: BP 151/93  Pulse 96  Temp(Src) 98 F (36.7 C) (Oral)  Resp 18  Ht 4\' 10"  (1.473 m)  Wt 134 lb (60.782 kg)  BMI 28.01 kg/m2  SpO2 100%  LMP 05/17/2013      FHT:  155 bpm, Mod Var, Early Decels, +Accels UC:   Q 2-54min, Moderate to Palpation, MVUs 210 SVE:   10/100/0-Vertex  Membranes: AROM Pitocin: None IUPC and FSE in place  Assessment:  IUP at 39.6wks Cat I FT  2nd Stage Labor  Plan: -Allow patient to labor down, until urge to push arises -Position changes to facilitate descent of fetus -Continue other mgmt as order -Dr. Lance Morin updated on patient status -Will reassess, prn  Barbaraann Boys, MSN 02/18/2014, 8:17 PM

## 2014-02-18 NOTE — MAU Note (Signed)
PT  SAYS SHE  HAVING UC     -   .  VE IN OFFICE YESTERDAY   3 CM.    DENIES HSV AND MRSA.   GBS- NEG.

## 2014-02-19 LAB — CBC
HCT: 29.3 % — ABNORMAL LOW (ref 36.0–46.0)
Hemoglobin: 10.3 g/dL — ABNORMAL LOW (ref 12.0–15.0)
MCH: 32.1 pg (ref 26.0–34.0)
MCHC: 35.2 g/dL (ref 30.0–36.0)
MCV: 91.3 fL (ref 78.0–100.0)
Platelets: 188 10*3/uL (ref 150–400)
RBC: 3.21 MIL/uL — ABNORMAL LOW (ref 3.87–5.11)
RDW: 13.4 % (ref 11.5–15.5)
WBC: 26 10*3/uL — ABNORMAL HIGH (ref 4.0–10.5)
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
## 2014-02-19 NOTE — Progress Notes (Signed)
Subjective: Postpartum Day 1: Vaginal delivery/repeat VBAC x 3, no laceration Patient c/o pain in mons area--denies dysuria, has voided this am.  Unsure if pain is uterine cramping.  Received Ibuprophen at 0550.  Has not used any Percocet. Patient up ad lib, reports no syncope or dizziness. Feeding: Breast and bottle Contraceptive plan:  Nexplanon  Passed moderate clot during night, but bleeding has been minimal since.  Objective: Vital signs in last 24 hours: Temp:  [97.1 F (36.2 C)-99.5 F (37.5 C)] 98.1 F (36.7 C) (05/30 0354) Pulse Rate:  [79-131] 93 (05/30 0354) Resp:  [16-20] 16 (05/30 0354) BP: (83-151)/(52-100) 116/73 mmHg (05/30 0354) SpO2:  [98 %-100 %] 100 % (05/29 1655)  Physical Exam:  General: alert Lochia: appropriate Uterine Fundus: firm Perineum: Clear, NT, no evidence of hematoma.  Mons NT, no edema.  Previous c/s scar area NT. DVT Evaluation: No evidence of DVT seen on physical exam. Negative Homan's sign.   Recent Labs  02/18/14 0810 02/19/14 0625  HGB 13.4 10.3*  HCT 36.6 29.3*  WBC count 26 (pre-delivery 15.3)  Assessment/Plan: Status post vaginal delivery day 1. Pelvic pain, likely due to cramping Leukocytosis without evidence of infection Stable Pain med now. Heat to area prn. Continue current care. Plan for discharge tomorrow    Nigel Bridgeman, CNM, MN 02/19/2014, 10:35 AM

## 2014-02-19 NOTE — Lactation Note (Signed)
This note was copied from the chart of Gabriela Marzelle Arabia. Lactation Consultation Note Resting holding baby in side lying position had finished BF. States its going well, denies soreness to nipples. Has good BF anatomy. States this is her 3rd baby, and she BF the others for 3-4 months. Stopped d/t nipple soreness. Mom encouraged to feed baby 8-12 times/24 hours and with feeding cues. Reviewed Baby & Me book's Breastfeeding Basics. Mom encouraged to feed baby w/feeding cues. Mom made aware of O/P services, breastfeeding support groups, community resources, and our phone # for post-discharge questions. WH/LC brochure given w/resources, support groups and LC services.Encouraged to call for assistance if needed and to verify proper latch.Referred to Baby and Me Book in Breastfeeding section Pg. 22-23 for position options and Proper latch demonstration.Encouraged comfort during BF so colostrum flows better and mom will enjoy the feeding longer. Taking deep breaths and breast massage during BF. Mom encouraged to waken baby for feeds. Mom states she knows how to hand express. Patient Name: Gabriela Benton [id number] Date: 02/19/2014     Maternal Data    Feeding Feeding Type: Breast Fed Length of feed: 15 min  [facility] Score/Interventions                      Lactation Tools Discussed/Used     Consult Status      Charyl Dancer 02/19/2014, 5:43 AM

## 2014-02-19 NOTE — Lactation Note (Signed)
This note was copied from the chart of Gabriela Brean Calbert. Lactation Consultation Note: Follow up visit with mom. She reports that baby has been feeding well but she is cramping so much she gave bottle of formula at the last feeding. Has heating pad to abdomen. Reports that right nipple is tender- looks intact at present. Encouraged to rub EBM into nipple. No questions at present. Encouraged to page for assist to make sure she is getting a deep latch.   Patient Name: Gabriela B[place] [id number] Date: 02/19/2014 Reason for consult: Follow-up assessment   Maternal Data Formula Feeding for Exclusion: Yes Reason for exclusion: Mother's choice to formula and breast feed on admission Infant to breast within first hour of birth: Yes Does the patient have breastfeeding experience prior to this delivery?: Yes  Feeding Feeding Type: Bottle Fed - Formula Nipple Type: Slow - flow  LATCH Score/Interventions       Type of Nipple: Everted at rest and after stimulation  Comfort (Breast/Nipple): Filling, red/small blisters or bruises, mild/mod discomfort  Problem noted: Mild/Moderate discomfort        Lactation Tools Discussed/Used     Consult Status Consult Status: Follow-up Date: 02/20/14 Follow-up type: In-patient    Pamelia Hoit 02/19/2014, 1:36 PM

## 2014-02-19 NOTE — Anesthesia Postprocedure Evaluation (Signed)
Anesthesia Post Note  Patient: Gabriela B[place]  Procedure(s) Performed: * No procedures listed *  Anesthesia type: Epidural  Patient location: Mother/Baby  Post pain: Pain level controlled  Post assessment: Post-op Vital signs reviewed  Last Vitals:  Filed Vitals:   02/19/14 0354  BP: 116/73  Pulse: 93  Temp: 36.7 C  Resp: 16    Post vital signs: Reviewed  Level of consciousness:alert  Complications: No apparent anesthesia complications

## 2014-02-20 ENCOUNTER — Ambulatory Visit: Payer: Self-pay

## 2014-02-20 LAB — CBC
HEMATOCRIT: 22.1 % — AB (ref 36.0–46.0)
Hemoglobin: 8.5 g/dL — ABNORMAL LOW (ref 12.0–15.0)
MCH: 32.1 pg (ref 26.0–34.0)
MCHC: 35.3 g/dL (ref 30.0–36.0)
MCV: 90.9 fL (ref 78.0–100.0)
Platelets: 241 10*3/uL (ref 150–400)
RBC: 2.43 MIL/uL — ABNORMAL LOW (ref 3.87–5.11)
RDW: 13.6 % (ref 11.5–15.5)
WBC: 20.7 10*3/uL — AB (ref 4.0–10.5)

## 2014-02-20 MED ORDER — FERROUS SULFATE 325 (65 FE) MG PO TABS: 325.0000 mg | ORAL_TABLET | Freq: Three times a day (TID) | ORAL | Status: DC

## 2014-02-20 MED ORDER — FERROUS SULFATE 325 (65 FE) MG PO TABS
325.0000 mg | ORAL_TABLET | Freq: Three times a day (TID) | ORAL | Status: DC
  Administered 2014-02-20 (×2): 325 mg via ORAL
  Filled 2014-02-20 (×2): qty 1

## 2014-02-20 MED ORDER — DOCUSATE SODIUM 100 MG PO CAPS: 100.0000 mg | ORAL_CAPSULE | Freq: Two times a day (BID) | ORAL | Status: DC

## 2014-02-20 MED ORDER — HYDROCORTISONE ACE-PRAMOXINE 1-1 % RE FOAM
1.0000 | Freq: Two times a day (BID) | RECTAL | Status: DC
  Administered 2014-02-20: 1 via RECTAL
  Filled 2014-02-20: qty 10

## 2014-02-20 MED ORDER — OXYCODONE-ACETAMINOPHEN 5-325 MG PO TABS: 1.0000 | ORAL_TABLET | ORAL | Status: DC | PRN

## 2014-02-20 MED ORDER — IBUPROFEN 600 MG PO TABS: 600.0000 mg | ORAL_TABLET | Freq: Four times a day (QID) | ORAL | Status: DC

## 2014-02-20 MED ORDER — PRAMOXINE HCL 1 % RE FOAM: Freq: Three times a day (TID) | RECTAL | Status: DC | PRN

## 2014-02-20 MED ORDER — HYDROCORTISONE ACE-PRAMOXINE 1-1 % RE FOAM: 1.0000 | Freq: Two times a day (BID) | RECTAL | Status: DC

## 2014-02-20 NOTE — Discharge Instructions (Signed)
Pelvic Rest HOME CARE INSTRUCTIONS  Do not have sexual intercourse, stimulation, or an orgasm.  Do not use tampons, douche, or put anything in the vagina.  Do not lift anything over 10 pounds (4.5 kg).  Avoid strenuous activity or straining your pelvic muscles. Document Released: 01/04/2011 Document Revised: 12/02/2011 Document Reviewed: 01/04/2011 [facility] Patient Information 2014 [place], [place].  Postpartum Depression and Baby Blues The postpartum period begins right after the birth of a baby. During this time, there is often a great amount of joy and excitement. It is also a time of considerable changes in the life of the parent(s). Regardless of how many times a mother gives birth, each child brings new challenges and dynamics to the family. It is not unusual to have feelings of excitement accompanied by confusing shifts in moods, emotions, and thoughts. All mothers are at risk of developing postpartum depression or the "baby blues." These mood changes can occur right after giving birth, or they may occur many months after giving birth. The baby blues or postpartum depression can be mild or severe. Additionally, postpartum depression can resolve rather quickly, or it can be a long-term condition. CAUSES Elevated hormones and their rapid decline are thought to be a main cause of postpartum depression and the baby blues. There are a number of hormones that radically change during and after pregnancy. Estrogen and progesterone usually decrease immediately after delivering your baby. The level of thyroid hormone and various cortisol steroids also rapidly drop. Other factors that play a major role in these changes include major life events and genetics.  RISK FACTORS If you have any of the following risks for the baby blues or postpartum depression, know what symptoms to watch out for during the postpartum period. Risk factors that may increase the likelihood of getting the baby blues or postpartum  depression include:  Havinga personal or family history of depression.  Having depression while being pregnant.  Having premenstrual or oral contraceptive-associated mood issues.  Having exceptional life stress.  Having marital conflict.  Lacking a social support network.  Having a baby with special needs.  Having health problems such as diabetes. SYMPTOMS Baby blues symptoms include:  Brief fluctuations in mood, such as going from extreme happiness to sadness.  Decreased concentration.  Difficulty sleeping.  Crying spells, tearfulness.  Irritability.  Anxiety. Postpartum depression symptoms typically begin within the first month after giving birth. These symptoms include:  Difficulty sleeping or excessive sleepiness.  Marked weight loss.  Agitation.  Feelings of worthlessness.  Lack of interest in activity or food. Postpartum psychosis is a very concerning condition and can be dangerous. Fortunately, it is rare. Displaying any of the following symptoms is cause for immediate medical attention. Postpartum psychosis symptoms include:  Hallucinations and delusions.  Bizarre or disorganized behavior.  Confusion or disorientation. DIAGNOSIS  A diagnosis is made by an evaluation of your symptoms. There are no medical or lab tests that lead to a diagnosis, but there are various questionnaires that a caregiver may use to identify those with the baby blues, postpartum depression, or psychosis. Often times, a screening tool called the [place] Postnatal Depression Scale is used to diagnose depression in the postpartum period.  TREATMENT The baby blues usually goes away on its own in 1 to 2 weeks. Social support is often all that is needed. You should be encouraged to get adequate sleep and rest. Occasionally, you may be given medicines to help you sleep.  Postpartum depression requires treatment as it can last  several months or longer if it is not treated. Treatment may  include individual or group therapy, medicine, or both to address any social, physiological, and psychological factors that may play a role in the depression. Regular exercise, a healthy diet, rest, and social support may also be strongly recommended.  Postpartum psychosis is more serious and needs treatment right away. Hospitalization is often needed. HOME CARE INSTRUCTIONS  Get as much rest as you can. Nap when the baby sleeps.  Exercise regularly. Some women find yoga and walking to be beneficial.  Eat a balanced and nourishing diet.  Do little things that you enjoy. Have a cup of tea, take a bubble bath, read your favorite magazine, or listen to your favorite music.  Avoid alcohol.  Ask for help with household chores, cooking, grocery shopping, or running errands as needed. Do not try to do everything.  Talk to people close to you about how you are feeling. Get support from your partner, family members, friends, or other new moms.  Try to stay positive in how you think. Think about the things you are grateful for.  Do not spend a lot of time alone.  Only take medicine as directed by your caregiver.  Keep all your postpartum appointments.  Let your caregiver know if you have any concerns. SEEK MEDICAL CARE IF: You are having a reaction or problems with your medicine. SEEK IMMEDIATE MEDICAL CARE IF:  You have suicidal feelings.  You feel you may harm the baby or someone else. Document Released: 06/13/2004 Document Revised: 12/02/2011 Document Reviewed: 06/21/2013 [facility] Patient Information 2014 [place]. Etonogestrel implant What is this medicine? ETONOGESTREL (et oh noe JES trel) is a contraceptive (birth control) device. It is used to prevent pregnancy. It can be used for up to 3 years. This medicine may be used for other purposes; ask your health care provider or pharmacist if you have questions. COMMON BRAND NAME(S): Implanon, Nexplanon  What should I tell my  health care provider before I take this medicine? They need to know if you have any of these conditions: -abnormal vaginal bleeding -blood vessel disease or blood clots -cancer of the breast, cervix, or liver -depression -diabetes -gallbladder disease -headaches -heart disease or recent heart attack -high blood pressure -high cholesterol -kidney disease -liver disease -renal disease -seizures -tobacco smoker -an unusual or allergic reaction to etonogestrel, other hormones, anesthetics or antiseptics, medicines, foods, dyes, or preservatives -pregnant or trying to get pregnant -breast-feeding How should I use this medicine? This device is inserted just under the skin on the inner side of your upper arm by a health care professional. Talk to your pediatrician regarding the use of this medicine in children. Special care may be needed. Overdosage: If you think you've taken too much of this medicine contact a poison control center or emergency room at once. Overdosage: If you think you have taken too much of this medicine contact a poison control center or emergency room at once. NOTE: This medicine is only for you. Do not share this medicine with others. What if I miss a dose? This does not apply. What may interact with this medicine? Do not take this medicine with any of the following medications: -amprenavir -bosentan -fosamprenavir This medicine may also interact with the following medications: -barbiturate medicines for inducing sleep or treating seizures -certain medicines for fungal infections like ketoconazole and itraconazole -griseofulvin -medicines to treat seizures like carbamazepine, felbamate, oxcarbazepine, phenytoin, topiramate -modafinil -phenylbutazone -rifampin -some medicines to treat HIV infection  like atazanavir, indinavir, lopinavir, nelfinavir, tipranavir, ritonavir -St. John's wort This list may not describe all possible interactions. Give your health  care provider a list of all the medicines, herbs, non-prescription drugs, or dietary supplements you use. Also tell them if you smoke, drink alcohol, or use illegal drugs. Some items may interact with your medicine. What should I watch for while using this medicine? This product does not protect you against HIV infection (AIDS) or other sexually transmitted diseases. You should be able to feel the implant by pressing your fingertips over the skin where it was inserted. Tell your doctor if you cannot feel the implant. What side effects may I notice from receiving this medicine? Side effects that you should report to your doctor or health care professional as soon as possible: -allergic reactions like skin rash, itching or hives, swelling of the face, lips, or tongue -breast lumps -changes in vision -confusion, trouble speaking or understanding -dark urine -depressed mood -general ill feeling or flu-like symptoms -light-colored stools -loss of appetite, nausea -right upper belly pain -severe headaches -severe pain, swelling, or tenderness in the abdomen -shortness of breath, chest pain, swelling in a leg -signs of pregnancy -sudden numbness or weakness of the face, arm or leg -trouble walking, dizziness, loss of balance or coordination -unusual vaginal bleeding, discharge -unusually weak or tired -yellowing of the eyes or skin Side effects that usually do not require medical attention (Report these to your doctor or health care professional if they continue or are bothersome.): -acne -breast pain -changes in weight -cough -fever or chills -headache -irregular menstrual bleeding -itching, burning, and vaginal discharge -pain or difficulty passing urine -sore throat This list may not describe all possible side effects. Call your doctor for medical advice about side effects. You may report side effects to FDA at 1-800-FDA-1088. Where should I keep my medicine? This drug is given in a  hospital or clinic and will not be stored at home. NOTE: This sheet is a summary. It may not cover all possible information. If you have questions about this medicine, talk to your doctor, pharmacist, or health care provider.  2014, Elsevier/Gold Standard. (2012-03-16 15:37:45)

## 2014-02-20 NOTE — Lactation Note (Signed)
This note was copied from the chart of Gabriela Benton. Lactation Consultation Note  Patient Name: Gabriela Benton Date: 02/20/2014 Reason for consult: Follow-up assessment (per breast feeding is going well ) LC reviewed sore nipple and engorgement prevention and tx , per mom has already  Received a hand pump from [facility] RN.  Mother informed of post-discharge support and given phone number to the lactation department, including services for phone call assistance; out-patient appointments; and breastfeeding support group. List of other breastfeeding resources in the community given in the handout. Encouraged mother to call for problems or concerns related to breastfeeding.   Maternal Data    Feeding    LATCH Score/Interventions                Intervention(s): Breastfeeding basics reviewed     Lactation Tools Discussed/Used Tools: Pump Breast pump type: Manual   Consult Status Consult Status: Complete    Matilde Sprang Dondi Aime 02/20/2014, 2:59 PM

## 2014-02-20 NOTE — Discharge Summary (Signed)
Vaginal Delivery Discharge Summary  Gabriela Benton  DOB:    July 09, 1991 MRN:    [id number] CSN:    [id number]  Date of admission:                  02/18/2014  Date of discharge:                   02/20/2014  Procedures this admission: VBAC w/o complication  Date of Delivery: 02/18/2014  Newborn Data:  Live born female  Birth Weight: 6 lb 13.4 oz (3100 g) APGAR: 9, 9  Home with mother. Name: [name] Circumcision Plan: N/A  History of Present Illness:  Ms. Gabriela Benton is a 23 y.o. female, [id number], who presents at [redacted]w[redacted]d weeks gestation. The patient has been followed at the [facility] division of [facility]. She was admitted onset of labor. Her pregnancy has been complicated by:   Patient Active Problem List   Diagnosis Date Noted  . Normal labor 02/18/2014  . Hx of preterm delivery, currently pregnant 02/18/2014  . Placental abruption, antepartum--2013 04/27/2012  . History of VBAC 04/21/2012  . H/O: cesarean section 04/21/2012    Hospital course:  The patient was admitted for active labor.   Her labor was not complicated, but she was of moderate risk as she was a proven VBAC patient. She proceeded to have a vaginal delivery of a healthy infant. Her delivery was performed by Gerrit Heck, CNM and was not complicated. Her postpartum course was not complicated. However, patient was diagnosed with asymptomatic anemia prior to discharge and started on Fe+ supplementation. She was discharged to home on postpartum day 2 doing well.  Feeding:  breast and bottle  Contraception:  Nexplanon  Discharge hemoglobin:  Hemoglobin  Date Value Ref Range Status  02/20/2014 8.5* 12.0 - 15.0 g/dL Final     REPEATED TO VERIFY     DELTA CHECK NOTED     HCT  Date Value Ref Range Status  02/20/2014 22.1* 36.0 - 46.0 % Final    Discharge Physical Exam:   General: alert, cooperative and no distress Lochia: appropriate Uterine Fundus:  firm at umbilicus Incision: N/A DVT Evaluation: No evidence of DVT seen on physical exam. Negative Homan's sign. No significant calf/ankle edema.  Intrapartum Procedures: spontaneous vaginal delivery and VBAC Postpartum Procedures: none Complications-Operative and Postpartum: none  Discharge Diagnoses: Term Pregnancy-delivered  Discharge Information:  Activity:           pelvic rest Diet:                routine Medications: Ibuprofen and Iron Condition:      stable Instructions:   Postpartum Care After Vaginal Delivery  After you deliver your newborn (postpartum period), the usual stay in the hospital is 24 72 hours. If there were problems with your labor or delivery, or if you have other medical problems, you might be in the hospital longer.  While you are in the hospital, you will receive help and instructions on how to care for yourself and your newborn during the postpartum period.  While you are in the hospital:  Be sure to tell your nurses if you have pain or discomfort, as well as where you feel the pain and what makes the pain worse.  If you had an incision made near your vagina (episiotomy) or if you had some tearing during delivery, the nurses may put ice packs on your episiotomy or tear. The ice packs  may help to reduce the pain and swelling.  If you are breastfeeding, you may feel uncomfortable contractions of your uterus for a couple of weeks. This is normal. The contractions help your uterus get back to normal size.  It is normal to have some bleeding after delivery.  For the first 1 3 days after delivery, the flow is red and the amount may be similar to a period.  It is common for the flow to start and stop.  In the first few days, you may pass some small clots. Let your nurses know if you begin to pass large clots or your flow increases.  Do not  flush blood clots down the toilet before having the nurse look at them.  During the next 3 10 days after delivery,  your flow should become more watery and pink or brown-tinged in color.  Ten to fourteen days after delivery, your flow should be a small amount of yellowish-white discharge.  The amount of your flow will decrease over the first few weeks after delivery. Your flow may stop in 6 8 weeks. Most women have had their flow stop by 12 weeks after delivery.  You should change your sanitary pads frequently.  Wash your hands thoroughly with soap and water for at least 20 seconds after changing pads, using the toilet, or before holding or feeding your newborn.  You should feel like you need to empty your bladder within the first 6 8 hours after delivery.  In case you become weak, lightheaded, or faint, call your nurse before you get out of bed for the first time and before you take a shower for the first time.  Within the first few days after delivery, your breasts may begin to feel tender and full. This is called engorgement. Breast tenderness usually goes away within 48 72 hours after engorgement occurs. You may also notice milk leaking from your breasts. If you are not breastfeeding, do not stimulate your breasts. Breast stimulation can make your breasts produce more milk.  Spending as much time as possible with your newborn is very important. During this time, you and your newborn can feel close and get to know each other. Having your newborn stay in your room (rooming in) will help to strengthen the bond with your newborn. It will give you time to get to know your newborn and become comfortable caring for your newborn.  Your hormones change after delivery. Sometimes the hormone changes can temporarily cause you to feel sad or tearful. These feelings should not last more than a few days. If these feelings last longer than that, you should talk to your caregiver.  If desired, talk to your caregiver about methods of family planning or contraception.  Talk to your caregiver about immunizations. Your  caregiver may want you to have the following immunizations before leaving the hospital:  Tetanus, diphtheria, and pertussis (Tdap) or tetanus and diphtheria (Td) immunization. It is very important that you and your family (including grandparents) or others caring for your newborn are up-to-date with the Tdap or Td immunizations. The Tdap or Td immunization can help protect your newborn from getting ill.  Rubella immunization.  Varicella (chickenpox) immunization.  Influenza immunization. You should receive this annual immunization if you did not receive the immunization during your pregnancy. Document Released: 07/07/2007 Document Revised: 06/03/2012 Document Reviewed: 05/06/2012 [facility] Patient Information 2014 [place], [place].   Postpartum Depression and Baby Blues  The postpartum period begins right after the birth of a baby.  During this time, there is often a great amount of joy and excitement. It is also a time of considerable changes in the life of the parent(s). Regardless of how many times a mother gives birth, each child brings new challenges and dynamics to the family. It is not unusual to have feelings of excitement accompanied by confusing shifts in moods, emotions, and thoughts. All mothers are at risk of developing postpartum depression or the "baby blues." These mood changes can occur right after giving birth, or they may occur many months after giving birth. The baby blues or postpartum depression can be mild or severe. Additionally, postpartum depression can resolve rather quickly, or it can be a long-term condition. CAUSES Elevated hormones and their rapid decline are thought to be a main cause of postpartum depression and the baby blues. There are a number of hormones that radically change during and after pregnancy. Estrogen and progesterone usually decrease immediately after delivering your baby. The level of thyroid hormone and various cortisol steroids also rapidly drop.  Other factors that play a major role in these changes include major life events and genetics.  RISK FACTORS If you have any of the following risks for the baby blues or postpartum depression, know what symptoms to watch out for during the postpartum period. Risk factors that may increase the likelihood of getting the baby blues or postpartum depression include:  Havinga personal or family history of depression.  Having depression while being pregnant.  Having premenstrual or oral contraceptive-associated mood issues.  Having exceptional life stress.  Having marital conflict.  Lacking a social support network.  Having a baby with special needs.  Having health problems such as diabetes. SYMPTOMS Baby blues symptoms include:  Brief fluctuations in mood, such as going from extreme happiness to sadness.  Decreased concentration.  Difficulty sleeping.  Crying spells, tearfulness.  Irritability.  Anxiety. Postpartum depression symptoms typically begin within the first month after giving birth. These symptoms include:  Difficulty sleeping or excessive sleepiness.  Marked weight loss.  Agitation.  Feelings of worthlessness.  Lack of interest in activity or food. Postpartum psychosis is a very concerning condition and can be dangerous. Fortunately, it is rare. Displaying any of the following symptoms is cause for immediate medical attention. Postpartum psychosis symptoms include:  Hallucinations and delusions.  Bizarre or disorganized behavior.  Confusion or disorientation. DIAGNOSIS  A diagnosis is made by an evaluation of your symptoms. There are no medical or lab tests that lead to a diagnosis, but there are various questionnaires that a caregiver may use to identify those with the baby blues, postpartum depression, or psychosis. Often times, a screening tool called the [place] Postnatal Depression Scale is used to diagnose depression in the postpartum period.   TREATMENT The baby blues usually goes away on its own in 1 to 2 weeks. Social support is often all that is needed. You should be encouraged to get adequate sleep and rest. Occasionally, you may be given medicines to help you sleep.  Postpartum depression requires treatment as it can last several months or longer if it is not treated. Treatment may include individual or group therapy, medicine, or both to address any social, physiological, and psychological factors that may play a role in the depression. Regular exercise, a healthy diet, rest, and social support may also be strongly recommended.  Postpartum psychosis is more serious and needs treatment right away. Hospitalization is often needed. HOME CARE INSTRUCTIONS  Get as much rest as you can. Nap when  the baby sleeps.  Exercise regularly. Some women find yoga and walking to be beneficial.  Eat a balanced and nourishing diet.  Do little things that you enjoy. Have a cup of tea, take a bubble bath, read your favorite magazine, or listen to your favorite music.  Avoid alcohol.  Ask for help with household chores, cooking, grocery shopping, or running errands as needed. Do not try to do everything.  Talk to people close to you about how you are feeling. Get support from your partner, family members, friends, or other new moms.  Try to stay positive in how you think. Think about the things you are grateful for.  Do not spend a lot of time alone.  Only take medicine as directed by your caregiver.  Keep all your postpartum appointments.  Let your caregiver know if you have any concerns. SEEK MEDICAL CARE IF: You are having a reaction or problems with your medicine. SEEK IMMEDIATE MEDICAL CARE IF:  You have suicidal feelings.  You feel you may harm the baby or someone else. Document Released: 06/13/2004 Document Revised: 12/02/2011 Document Reviewed: 07/16/2011 [facility] Patient Information 2014 [place], [place].   Discharge to:  home  Follow-up Information   Follow up with [facility]. Schedule an appointment as soon as possible for a visit in 5 weeks. (Please call if you have any questions or concerns prior to your next visit. )    Specialty:  Obstetrics and Gynecology   Contact information:   [address] [place] [address] [phone]       Gerrit Heck, CNM, MSN 02/20/2014

## 2014-02-22 NOTE — Progress Notes (Signed)
Post discharge chart review completed.  

## 2014-07-14 ENCOUNTER — Inpatient Hospital Stay (HOSPITAL_COMMUNITY)
Admission: AD | Admit: 2014-07-14 | Discharge: 2014-07-14 | Disposition: A | Discharge status: Home / Self Care | Payer: Medicaid Other | Source: Ambulatory Visit | Attending: Obstetrics and Gynecology | Admitting: Obstetrics and Gynecology

## 2014-07-25 ENCOUNTER — Encounter (HOSPITAL_COMMUNITY): Payer: Self-pay | Admitting: *Deleted

## 2014-09-27 ENCOUNTER — Inpatient Hospital Stay (HOSPITAL_COMMUNITY)
Admission: AD | Admit: 2014-09-27 | Discharge: 2014-09-27 | Disposition: A | Discharge status: Home / Self Care | Payer: Medicaid Other | Source: Ambulatory Visit | Attending: Obstetrics & Gynecology | Admitting: Obstetrics & Gynecology

## 2014-09-27 ENCOUNTER — Encounter (HOSPITAL_COMMUNITY): Payer: Self-pay | Admitting: *Deleted

## 2014-09-27 DIAGNOSIS — Z3009 Encounter for other general counseling and advice on contraception: Secondary | ICD-10-CM | POA: Insufficient documentation

## 2014-09-27 DIAGNOSIS — R109 Unspecified abdominal pain: Secondary | ICD-10-CM | POA: Diagnosis present

## 2014-09-27 DIAGNOSIS — R1084 Generalized abdominal pain: Secondary | ICD-10-CM | POA: Diagnosis not present

## 2014-09-27 LAB — URINALYSIS, ROUTINE W REFLEX MICROSCOPIC
Bilirubin Urine: NEGATIVE
Glucose, UA: NEGATIVE mg/dL
Hgb urine dipstick: NEGATIVE
KETONES UR: NEGATIVE mg/dL
LEUKOCYTES UA: NEGATIVE
NITRITE: NEGATIVE
PH: 6 (ref 5.0–8.0)
PROTEIN: NEGATIVE mg/dL
UROBILINOGEN UA: 0.2 mg/dL (ref 0.0–1.0)

## 2014-09-27 LAB — PROCEDURE REPORT - SCANNED: PAP SMEAR: NEGATIVE

## 2014-09-27 LAB — POCT PREGNANCY, URINE: PREG TEST UR: NEGATIVE

## 2014-09-27 MED ORDER — NORGESTIMATE-ETH ESTRADIOL 0.25-35 MG-MCG PO TABS: 1.0000 | ORAL_TABLET | Freq: Every day | ORAL | Status: DC

## 2014-09-27 MED ORDER — IBUPROFEN 600 MG PO TABS: 600.0000 mg | ORAL_TABLET | Freq: Four times a day (QID) | ORAL | Status: DC | PRN

## 2014-09-27 NOTE — MAU Note (Signed)
Having problem with stomachache-for a month. Been having cramping.  Sometimes has pain in rt leg, get numb. Starts to hurt real bad if stands too long.  Has been having this leg pain since had epid (first in 2011) was worse when preg... Pain continues.
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
## 2014-12-03 ENCOUNTER — Encounter (HOSPITAL_COMMUNITY): Payer: Self-pay | Admitting: Emergency Medicine

## 2014-12-03 ENCOUNTER — Emergency Department (INDEPENDENT_AMBULATORY_CARE_PROVIDER_SITE_OTHER)
Admission: EM | Admit: 2014-12-03 | Discharge: 2014-12-03 | Disposition: A | Discharge status: Home / Self Care | Payer: Medicaid Other | Source: Home / Self Care | Attending: Family Medicine | Admitting: Family Medicine

## 2014-12-03 DIAGNOSIS — M545 Low back pain, unspecified: Secondary | ICD-10-CM

## 2014-12-03 DIAGNOSIS — S161XXA Strain of muscle, fascia and tendon at neck level, initial encounter: Secondary | ICD-10-CM

## 2014-12-03 DIAGNOSIS — T148XXA Other injury of unspecified body region, initial encounter: Secondary | ICD-10-CM

## 2014-12-03 DIAGNOSIS — Z043 Encounter for examination and observation following other accident: Secondary | ICD-10-CM

## 2014-12-03 DIAGNOSIS — Z041 Encounter for examination and observation following transport accident: Secondary | ICD-10-CM

## 2014-12-03 DIAGNOSIS — T148 Other injury of unspecified body region: Secondary | ICD-10-CM | POA: Diagnosis not present

## 2014-12-03 MED ORDER — DICLOFENAC POTASSIUM 50 MG PO TABS: 50.0000 mg | ORAL_TABLET | Freq: Three times a day (TID) | ORAL | Status: DC

## 2014-12-03 NOTE — Discharge Instructions (Signed)
Back Pain, Adult Low back pain is very common. About 1 in 5 people have back pain.The cause of low back pain is rarely dangerous. The pain often gets better over time.About half of people with a sudden onset of back pain feel better in just 2 weeks. About 8 in 10 people feel better by 6 weeks.  CAUSES Some common causes of back pain include:  Strain of the muscles or ligaments supporting the spine.  Wear and tear (degeneration) of the spinal discs.  Arthritis.  Direct injury to the back. DIAGNOSIS Most of the time, the direct cause of low back pain is not known.However, back pain can be treated effectively even when the exact cause of the pain is unknown.Answering your caregiver's questions about your overall health and symptoms is one of the most accurate ways to make sure the cause of your pain is not dangerous. If your caregiver needs more information, he or she may order lab work or imaging tests (X-rays or MRIs).However, even if imaging tests show changes in your back, this usually does not require surgery. HOME CARE INSTRUCTIONS For many people, back pain returns.Since low back pain is rarely dangerous, it is often a condition that people can learn to [facility] their own.   Remain active. It is stressful on the back to sit or stand in one place. Do not sit, drive, or stand in one place for more than 30 minutes at a time. Take short walks on level surfaces as soon as pain allows.Try to increase the length of time you walk each day.  Do not stay in bed.Resting more than 1 or 2 days can delay your recovery.  Do not avoid exercise or work.Your body is made to move.It is not dangerous to be active, even though your back may hurt.Your back will likely heal faster if you return to being active before your pain is gone.  Pay attention to your body when you bend and lift. Many people have less discomfortwhen lifting if they bend their knees, keep the load close to their bodies,and  avoid twisting. Often, the most comfortable positions are those that put less stress on your recovering back.  Find a comfortable position to sleep. Use a firm mattress and lie on your side with your knees slightly bent. If you lie on your back, put a pillow under your knees.  Only take over-the-counter or prescription medicines as directed by your caregiver. Over-the-counter medicines to reduce pain and inflammation are often the most helpful.Your caregiver may prescribe muscle relaxant drugs.These medicines help dull your pain so you can more quickly return to your normal activities and healthy exercise.  Put ice on the injured area.  Put ice in a plastic bag.  Place a towel between your skin and the bag.  Leave the ice on for 15-20 minutes, 03-04 times a day for the first 2 to 3 days. After that, ice and heat may be alternated to reduce pain and spasms.  Ask your caregiver about trying back exercises and gentle massage. This may be of some benefit.  Avoid feeling anxious or stressed.Stress increases muscle tension and can worsen back pain.It is important to recognize when you are anxious or stressed and learn ways to manage it.Exercise is a great option. SEEK MEDICAL CARE IF:  You have pain that is not relieved with rest or medicine.  You have pain that does not improve in 1 week.  You have new symptoms.  You are generally not feeling well. SEEK  IMMEDIATE MEDICAL CARE IF:   You have pain that radiates from your back into your legs.  You develop new bowel or bladder control problems.  You have unusual weakness or numbness in your arms or legs.  You develop nausea or vomiting.  You develop abdominal pain.  You feel faint. Document Released: 09/09/2005 Document Revised: 03/10/2012 Document Reviewed: 01/11/2014 [facility] Patient Information 2015 [place], [place]. This information is not intended to replace advice given to you by your health care provider. Make sure you  discuss any questions you have with your health care provider.  Cervical Sprain A cervical sprain is when the tissues (ligaments) that hold the neck bones in place stretch or tear. HOME CARE   Put ice on the injured area.  Put ice in a plastic bag.  Place a towel between your skin and the bag.  Leave the ice on for 15-20 minutes, 3-4 times a day.  You may have been given a collar to wear. This collar keeps your neck from moving while you heal.  Do not take the collar off unless told by your doctor.  If you have long hair, keep it outside of the collar.  Ask your doctor before changing the position of your collar. You may need to change its position over time to make it more comfortable.  If you are allowed to take off the collar for cleaning or bathing, follow your doctor's instructions on how to do it safely.  Keep your collar clean by wiping it with mild soap and water. Dry it completely. If the collar has removable pads, remove them every 1-2 days to hand wash them with soap and water. Allow them to air dry. They should be dry before you wear them in the collar.  Do not drive while wearing the collar.  Only take medicine as told by your doctor.  Keep all doctor visits as told.  Keep all physical therapy visits as told.  Adjust your work station so that you have good posture while you work.  Avoid positions and activities that make your problems worse.  Warm up and stretch before being active. GET HELP IF:  Your pain is not controlled with medicine.  You cannot take less pain medicine over time as planned.  Your activity level does not improve as expected. GET HELP RIGHT AWAY IF:   You are bleeding.  Your stomach is upset.  You have an allergic reaction to your medicine.  You develop new problems that you cannot explain.  You lose feeling (become numb) or you cannot move any part of your body (paralysis).  You have tingling or weakness in any part of your  body.  Your symptoms get worse. Symptoms include:  Pain, soreness, stiffness, puffiness (swelling), or a burning feeling in your neck.  Pain when your neck is touched.  Shoulder or upper back pain.  Limited ability to move your neck.  Headache.  Dizziness.  Your hands or arms feel week, lose feeling, or tingle.  Muscle spasms.  Difficulty swallowing or chewing. MAKE SURE YOU:   Understand these instructions.  Will watch your condition.  Will get help right away if you are not doing well or get worse. Document Released: 02/26/2008 Document Revised: 05/12/2013 Document Reviewed: 03/17/2013 [facility] Patient Information 2015 [place], [place]. This information is not intended to replace advice given to you by your health care provider. Make sure you discuss any questions you have with your health care provider.  Heat Therapy Heat therapy can  help make painful, stiff muscles and joints feel better. Do not use heat on new injuries. Wait at least 48 hours after an injury to use heat. Do not use heat when you have aches or pains right after an activity. If you still have pain 3 hours after stopping the activity, then you may use heat. HOME CARE Wet heat pack  Soak a clean towel in warm water. Squeeze out the extra water.  Put the warm, wet towel in a plastic bag.  Place a thin, dry towel between your skin and the bag.  Put the heat pack on the area for 5 minutes, and check your skin. Your skin may be pink, but it should not be red.  Leave the heat pack on the area for 15 to 30 minutes.  Repeat this every 2 to 4 hours while awake. Do not use heat while you are sleeping. Warm water bath  Fill a tub with warm water.  Place the affected body part in the tub.  Soak the area for 20 to 40 minutes.  Repeat as needed. Hot water bottle  Fill the water bottle half full with hot water.  Press out the extra air. Close the cap tightly.  Place a dry towel between your skin and  the bottle.  Put the bottle on the area for 5 minutes, and check your skin. Your skin may be pink, but it should not be red.  Leave the bottle on the area for 15 to 30 minutes.  Repeat this every 2 to 4 hours while awake. Electric heating pad  Place a dry towel between your skin and the heating pad.  Set the heating pad on low heat.  Put the heating pad on the area for 10 minutes, and check your skin. Your skin may be pink, but it should not be red.  Leave the heating pad on the area for 20 to 40 minutes.  Repeat this every 2 to 4 hours while awake.  Do not lie on the heating pad.  Do not fall asleep while using the heating pad.  Do not use the heating pad near water. GET HELP RIGHT AWAY IF:  You get blisters or red skin.  Your skin is puffy (swollen), or you lose feeling (numbness) in the affected area.  You have any new problems.  Your problems are getting worse.  You have any questions or concerns. If you have any problems, stop using heat therapy until you see your doctor. MAKE SURE YOU:  Understand these instructions.  Will watch your condition.  Will get help right away if you are not doing well or get worse. Document Released: 12/02/2011 Document Reviewed: 11/02/2013 [facility] Patient Information 2015 [place]. This information is not intended to replace advice given to you by your health care provider. Make sure you discuss any questions you have with your health care provider.  Muscle Strain A muscle strain (pulled muscle) happens when a muscle is stretched beyond normal length. It happens when a sudden, violent force stretches your muscle too far. Usually, a few of the fibers in your muscle are torn. Muscle strain is common in athletes. Recovery usually takes 1-2 weeks. Complete healing takes 5-6 weeks.  HOME CARE   Follow the PRICE method of treatment to help your injury get better. Do this the first 2-3 days after the injury:  Protect. Protect the  muscle to keep it from getting injured again.  Rest. Limit your activity and rest the injured body  part.  Ice. Put ice in a plastic bag. Place a towel between your skin and the bag. Then, apply the ice and leave it on from 15-20 minutes each hour. After the third day, switch to moist heat packs.  Compression. Use a splint or elastic bandage on the injured area for comfort. Do not put it on too tightly.  Elevate. Keep the injured body part above the level of your heart.  Only take medicine as told by your doctor.  Warm up before doing exercise to prevent future muscle strains. GET HELP IF:   You have more pain or puffiness (swelling) in the injured area.  You feel numbness, tingling, or notice a loss of strength in the injured area. MAKE SURE YOU:   Understand these instructions.  Will watch your condition.  Will get help right away if you are not doing well or get worse. Document Released: 06/18/2008 Document Revised: 06/30/2013 Document Reviewed: 04/08/2013 [facility] Patient Information 2015 [place], [place]. This information is not intended to replace advice given to you by your health care provider. Make sure you discuss any questions you have with your health care provider.
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
## 2014-12-11 ENCOUNTER — Emergency Department (INDEPENDENT_AMBULATORY_CARE_PROVIDER_SITE_OTHER)
Admission: EM | Admit: 2014-12-11 | Discharge: 2014-12-11 | Disposition: A | Discharge status: Home / Self Care | Payer: Medicaid Other | Source: Home / Self Care | Attending: Family Medicine | Admitting: Family Medicine

## 2014-12-11 ENCOUNTER — Encounter (HOSPITAL_COMMUNITY): Payer: Self-pay | Admitting: *Deleted

## 2014-12-11 ENCOUNTER — Emergency Department (INDEPENDENT_AMBULATORY_CARE_PROVIDER_SITE_OTHER): Payer: Medicaid Other

## 2014-12-11 DIAGNOSIS — S161XXA Strain of muscle, fascia and tendon at neck level, initial encounter: Secondary | ICD-10-CM

## 2014-12-11 MED ORDER — IBUPROFEN 600 MG PO TABS: 600.0000 mg | ORAL_TABLET | Freq: Three times a day (TID) | ORAL | Status: DC | PRN

## 2014-12-11 MED ORDER — METHOCARBAMOL 500 MG PO TABS: 500.0000 mg | ORAL_TABLET | Freq: Three times a day (TID) | ORAL | Status: DC | PRN

## 2014-12-11 NOTE — Discharge Instructions (Signed)
Thank you for coming in today. Come back or go to the emergency room if you notice new weakness new numbness problems walking or bowel or bladder problems. Follow up with Dr. Katrinka Blazing.    Cervical Strain and Sprain (Whiplash) with Rehab Cervical strain and sprain are injuries that commonly occur with "whiplash" injuries. Whiplash occurs when the neck is forcefully whipped backward or forward, such as during a motor vehicle accident or during contact sports. The muscles, ligaments, tendons, discs, and nerves of the neck are susceptible to injury when this occurs. RISK FACTORS Risk of having a whiplash injury increases if:  Osteoarthritis of the spine.  Situations that make head or neck accidents or trauma more likely.  High-risk sports (football, rugby, wrestling, hockey, auto racing, gymnastics, diving, contact karate, or boxing).  Poor strength and flexibility of the neck.  Previous neck injury.  Poor tackling technique.  Improperly fitted or padded equipment. SYMPTOMS   Pain or stiffness in the front or back of neck or both.  Symptoms may present immediately or up to 24 hours after injury.  Dizziness, headache, nausea, and vomiting.  Muscle spasm with soreness and stiffness in the neck.  Tenderness and swelling at the injury site. PREVENTION  Learn and use proper technique (avoid tackling with the head, spearing, and head-butting; use proper falling techniques to avoid landing on the head).  Warm up and stretch properly before activity.  Maintain physical fitness:  Strength, flexibility, and endurance.  Cardiovascular fitness.  Wear properly fitted and padded protective equipment, such as padded soft collars, for participation in contact sports. PROGNOSIS  Recovery from cervical strain and sprain injuries is dependent on the extent of the injury. These injuries are usually curable in 1 week to 3 months with appropriate treatment.  RELATED COMPLICATIONS   Temporary  numbness and weakness may occur if the nerve roots are damaged, and this may persist until the nerve has completely healed.  Chronic pain due to frequent recurrence of symptoms.  Prolonged healing, especially if activity is resumed too soon (before complete recovery). TREATMENT  Treatment initially involves the use of ice and medication to help reduce pain and inflammation. It is also important to perform strengthening and stretching exercises and modify activities that worsen symptoms so the injury does not get worse. These exercises may be performed at home or with a therapist. For patients who experience severe symptoms, a soft, padded collar may be recommended to be worn around the neck.  Improving your posture may help reduce symptoms. Posture improvement includes pulling your chin and abdomen in while sitting or standing. If you are sitting, sit in a firm chair with your buttocks against the back of the chair. While sleeping, try replacing your pillow with a small towel rolled to 2 inches in diameter, or use a cervical pillow or soft cervical collar. Poor sleeping positions delay healing.  For patients with nerve root damage, which causes numbness or weakness, the use of a cervical traction apparatus may be recommended. Surgery is rarely necessary for these injuries. However, cervical strain and sprains that are present at birth (congenital) may require surgery. MEDICATION   If pain medication is necessary, nonsteroidal anti-inflammatory medications, such as aspirin and ibuprofen, or other minor pain relievers, such as acetaminophen, are often recommended.  Do not take pain medication for 7 days before surgery.  Prescription pain relievers may be given if deemed necessary by your caregiver. Use only as directed and only as much as you need. HEAT AND COLD:  Cold treatment (icing) relieves pain and reduces inflammation. Cold treatment should be applied for 10 to 15 minutes every 2 to 3 hours  for inflammation and pain and immediately after any activity that aggravates your symptoms. Use ice packs or an ice massage.  Heat treatment may be used prior to performing the stretching and strengthening activities prescribed by your caregiver, physical therapist, or athletic trainer. Use a heat pack or a warm soak. SEEK MEDICAL CARE IF:   Symptoms get worse or do not improve in 2 weeks despite treatment.  New, unexplained symptoms develop (drugs used in treatment may produce side effects). EXERCISES RANGE OF MOTION (ROM) AND STRETCHING EXERCISES - Cervical Strain and Sprain These exercises may help you when beginning to rehabilitate your injury. In order to successfully resolve your symptoms, you must improve your posture. These exercises are designed to help reduce the forward-head and rounded-shoulder posture which contributes to this condition. Your symptoms may resolve with or without further involvement from your physician, physical therapist or athletic trainer. While completing these exercises, remember:   Restoring tissue flexibility helps normal motion to return to the joints. This allows healthier, less painful movement and activity.  An effective stretch should be held for at least 20 seconds, although you may need to begin with shorter hold times for comfort.  A stretch should never be painful. You should only feel a gentle lengthening or release in the stretched tissue. STRETCH- Axial Extensors  Lie on your back on the floor. You may bend your knees for comfort. Place a rolled-up hand towel or dish towel, about 2 inches in diameter, under the part of your head that makes contact with the floor.  Gently tuck your chin, as if trying to make a "double chin," until you feel a gentle stretch at the base of your head.  Hold __________ seconds. Repeat __________ times. Complete this exercise __________ times per day.  STRETCH - Axial Extension   Stand or sit on a firm surface.  Assume a good posture: chest up, shoulders drawn back, abdominal muscles slightly tense, knees unlocked (if standing) and feet hip width apart.  Slowly retract your chin so your head slides back and your chin slightly lowers. Continue to look straight ahead.  You should feel a gentle stretch in the back of your head. Be certain not to feel an aggressive stretch since this can cause headaches later.  Hold for __________ seconds. Repeat __________ times. Complete this exercise __________ times per day. STRETCH - Cervical Side Bend   Stand or sit on a firm surface. Assume a good posture: chest up, shoulders drawn back, abdominal muscles slightly tense, knees unlocked (if standing) and feet hip width apart.  Without letting your nose or shoulders move, slowly tip your right / left ear to your shoulder until your feel a gentle stretch in the muscles on the opposite side of your neck.  Hold __________ seconds. Repeat __________ times. Complete this exercise __________ times per day. STRETCH - Cervical Rotators   Stand or sit on a firm surface. Assume a good posture: chest up, shoulders drawn back, abdominal muscles slightly tense, knees unlocked (if standing) and feet hip width apart.  Keeping your eyes level with the ground, slowly turn your head until you feel a gentle stretch along the back and opposite side of your neck.  Hold __________ seconds. Repeat __________ times. Complete this exercise __________ times per day. RANGE OF MOTION - Neck Circles   Stand or sit on a  firm surface. Assume a good posture: chest up, shoulders drawn back, abdominal muscles slightly tense, knees unlocked (if standing) and feet hip width apart.  Gently roll your head down and around from the back of one shoulder to the back of the other. The motion should never be forced or painful.  Repeat the motion 10-20 times, or until you feel the neck muscles relax and loosen. Repeat __________ times. Complete the  exercise __________ times per day. STRENGTHENING EXERCISES - Cervical Strain and Sprain These exercises may help you when beginning to rehabilitate your injury. They may resolve your symptoms with or without further involvement from your physician, physical therapist, or athletic trainer. While completing these exercises, remember:   Muscles can gain both the endurance and the strength needed for everyday activities through controlled exercises.  Complete these exercises as instructed by your physician, physical therapist, or athletic trainer. Progress the resistance and repetitions only as guided.  You may experience muscle soreness or fatigue, but the pain or discomfort you are trying to eliminate should never worsen during these exercises. If this pain does worsen, stop and make certain you are following the directions exactly. If the pain is still present after adjustments, discontinue the exercise until you can discuss the trouble with your clinician. STRENGTH - Cervical Flexors, Isometric  Face a wall, standing about 6 inches away. Place a small pillow, a ball about 6-8 inches in diameter, or a folded towel between your forehead and the wall.  Slightly tuck your chin and gently push your forehead into the soft object. Push only with mild to moderate intensity, building up tension gradually. Keep your jaw and forehead relaxed.  Hold 10 to 20 seconds. Keep your breathing relaxed.  Release the tension slowly. Relax your neck muscles completely before you start the next repetition. Repeat __________ times. Complete this exercise __________ times per day. STRENGTH- Cervical Lateral Flexors, Isometric   Stand about 6 inches away from a wall. Place a small pillow, a ball about 6-8 inches in diameter, or a folded towel between the side of your head and the wall.  Slightly tuck your chin and gently tilt your head into the soft object. Push only with mild to moderate intensity, building up tension  gradually. Keep your jaw and forehead relaxed.  Hold 10 to 20 seconds. Keep your breathing relaxed.  Release the tension slowly. Relax your neck muscles completely before you start the next repetition. Repeat __________ times. Complete this exercise __________ times per day. STRENGTH - Cervical Extensors, Isometric   Stand about 6 inches away from a wall. Place a small pillow, a ball about 6-8 inches in diameter, or a folded towel between the back of your head and the wall.  Slightly tuck your chin and gently tilt your head back into the soft object. Push only with mild to moderate intensity, building up tension gradually. Keep your jaw and forehead relaxed.  Hold 10 to 20 seconds. Keep your breathing relaxed.  Release the tension slowly. Relax your neck muscles completely before you start the next repetition. Repeat __________ times. Complete this exercise __________ times per day. POSTURE AND BODY MECHANICS CONSIDERATIONS - Cervical Strain and Sprain Keeping correct posture when sitting, standing or completing your activities will reduce the stress put on different body tissues, allowing injured tissues a chance to heal and limiting painful experiences. The following are general guidelines for improved posture. Your physician or physical therapist will provide you with any instructions specific to your needs. While reading  these guidelines, remember:  The exercises prescribed by your provider will help you have the flexibility and strength to maintain correct postures.  The correct posture provides the optimal environment for your joints to work. All of your joints have less wear and tear when properly supported by a spine with good posture. This means you will experience a healthier, less painful body.  Correct posture must be practiced with all of your activities, especially prolonged sitting and standing. Correct posture is as important when doing repetitive low-stress activities (typing)  as it is when doing a single heavy-load activity (lifting). PROLONGED STANDING WHILE SLIGHTLY LEANING FORWARD When completing a task that requires you to lean forward while standing in one place for a long time, place either foot up on a stationary 2- to 4-inch high object to help maintain the best posture. When both feet are on the ground, the low back tends to lose its slight inward curve. If this curve flattens (or becomes too large), then the back and your other joints will experience too much stress, fatigue more quickly, and can cause pain.  RESTING POSITIONS Consider which positions are most painful for you when choosing a resting position. If you have pain with flexion-based activities (sitting, bending, stooping, squatting), choose a position that allows you to rest in a less flexed posture. You would want to avoid curling into a fetal position on your side. If your pain worsens with extension-based activities (prolonged standing, working overhead), avoid resting in an extended position such as sleeping on your stomach. Most people will find more comfort when they rest with their spine in a more neutral position, neither too rounded nor too arched. Lying on a non-sagging bed on your side with a pillow between your knees, or on your back with a pillow under your knees will often provide some relief. Keep in mind, being in any one position for a prolonged period of time, no matter how correct your posture, can still lead to stiffness. WALKING Walk with an upright posture. Your ears, shoulders, and hips should all line up. OFFICE WORK When working at a desk, create an environment that supports good, upright posture. Without extra support, muscles fatigue and lead to excessive strain on joints and other tissues. CHAIR:  A chair should be able to slide under your desk when your back makes contact with the back of the chair. This allows you to work closely.  The chair's height should allow your eyes  to be level with the upper part of your monitor and your hands to be slightly lower than your elbows.  Body position:  Your feet should make contact with the floor. If this is not possible, use a foot rest.  Keep your ears over your shoulders. This will reduce stress on your neck and low back. Document Released: 09/09/2005 Document Revised: 01/24/2014 Document Reviewed: 12/22/2008 [facility] Patient Information 2015 [place], [place]. This information is not intended to replace advice given to you by your health care provider. Make sure you discuss any questions you have with your health care provider.
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
## 2015-05-06 ENCOUNTER — Emergency Department (HOSPITAL_BASED_OUTPATIENT_CLINIC_OR_DEPARTMENT_OTHER)
Admit: 2015-05-06 | Discharge: 2015-05-06 | Disposition: A | Discharge status: Home / Self Care | Payer: Medicaid Other | Attending: Emergency Medicine | Admitting: Emergency Medicine

## 2015-05-06 ENCOUNTER — Emergency Department (HOSPITAL_COMMUNITY)
Admission: EM | Admit: 2015-05-06 | Discharge: 2015-05-06 | Disposition: A | Discharge status: Home / Self Care | Payer: Medicaid Other | Attending: Emergency Medicine | Admitting: Emergency Medicine

## 2015-05-06 ENCOUNTER — Encounter (HOSPITAL_COMMUNITY): Payer: Self-pay | Admitting: *Deleted

## 2015-05-06 DIAGNOSIS — Z87440 Personal history of urinary (tract) infections: Secondary | ICD-10-CM | POA: Insufficient documentation

## 2015-05-06 DIAGNOSIS — J020 Streptococcal pharyngitis: Secondary | ICD-10-CM | POA: Diagnosis not present

## 2015-05-06 DIAGNOSIS — M79609 Pain in unspecified limb: Secondary | ICD-10-CM

## 2015-05-06 DIAGNOSIS — I82401 Acute embolism and thrombosis of unspecified deep veins of right lower extremity: Secondary | ICD-10-CM

## 2015-05-06 DIAGNOSIS — Z8619 Personal history of other infectious and parasitic diseases: Secondary | ICD-10-CM | POA: Insufficient documentation

## 2015-05-06 DIAGNOSIS — I82621 Acute embolism and thrombosis of deep veins of right upper extremity: Secondary | ICD-10-CM | POA: Insufficient documentation

## 2015-05-06 DIAGNOSIS — Z62810 Personal history of physical and sexual abuse in childhood: Secondary | ICD-10-CM | POA: Diagnosis not present

## 2015-05-06 DIAGNOSIS — Z79899 Other long term (current) drug therapy: Secondary | ICD-10-CM | POA: Diagnosis not present

## 2015-05-06 DIAGNOSIS — M79602 Pain in left arm: Secondary | ICD-10-CM | POA: Diagnosis present

## 2015-05-06 LAB — RAPID STREP SCREEN (MED CTR MEBANE ONLY): Streptococcus, Group A Screen (Direct): POSITIVE — AB

## 2015-05-06 MED ORDER — RIVAROXABAN (XARELTO) EDUCATION KIT FOR DVT/PE PATIENTS
PACK | Freq: Once | Status: AC
  Administered 2015-05-06: 10:00:00
  Filled 2015-05-06: qty 1

## 2015-05-06 MED ORDER — XARELTO VTE STARTER PACK 15 & 20 MG PO TBPK: 15.0000 mg | ORAL_TABLET | ORAL | Status: DC

## 2015-05-06 MED ORDER — AMOXICILLIN 500 MG PO CAPS: 500.0000 mg | ORAL_CAPSULE | Freq: Three times a day (TID) | ORAL | Status: DC

## 2015-05-06 MED ORDER — IBUPROFEN 400 MG PO TABS
800.0000 mg | ORAL_TABLET | Freq: Once | ORAL | Status: AC
Start: 2015-05-06 — End: 2015-05-06
  Administered 2015-05-06: 800 mg via ORAL
  Filled 2015-05-06: qty 2

## 2015-05-06 MED ORDER — RIVAROXABAN 15 MG PO TABS
15.0000 mg | ORAL_TABLET | Freq: Once | ORAL | Status: AC
  Administered 2015-05-06: 15 mg via ORAL
  Filled 2015-05-06: qty 1

## 2015-05-06 NOTE — Care Management Note (Signed)
Case Management Note  Patient Details  Name: Gabriela Benton MRN: [id number] Date of Birth: [birth year]  Subjective/Objective:    24 y.o. F diagnosed with DVT. Pt has Medicaid [place] Access but is unaware of who her assigned PCP is at this time. Writer spoke with pt to make certain she understands how important a f/u appt with PCP is..the patient verbalized instructions that have been given to her so far during this EDV. 1) Take Xarelto as directed 2) Fill Rx ASAP with 30 d free coupon 3) Call [facility] on Monday and ascertain PCP name. 4) If unable to get f/u appt call Case Manager in [facility] back and we will assist with appt. No further questions.   Pt does have access to a smartphone and is able to go online and access [email or web address] as well.              Action/Plan: Will sign off for now.    Expected Discharge Date:                  Expected Discharge Plan:     In-House Referral:  PCP / [facility]  Discharge planning Services  CM Consult (pt has Medicaid [place] Access)  Post Acute Care Choice:    Choice offered to:  Patient  DME Arranged:    DME Agency:     HH Arranged:    HH Agency:     Status of Service:  Completed, signed off  Medicare Important Message Given:    Date Medicare IM Given:    Medicare IM give by:    Date Additional Medicare IM Given:    Additional Medicare Important Message give by:     If discussed at Long Length of Stay Meetings, dates discussed:    Additional Comments:  Yvone Neu, RN 05/06/2015, 10:00 AM

## 2015-05-06 NOTE — Progress Notes (Signed)
Right upper extremity venous duplex completed:  DVT noted in the jugular vein.

## 2015-05-06 NOTE — ED Notes (Signed)
PT reports Rt arm pain since Friday. 

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
## 2015-05-06 NOTE — Discharge Instructions (Signed)
Deep Vein Thrombosis Takes Xarelto as prescribed for DVT. Take anabiotic says prescribed for strep throat. Follow-up with the primary care provider that case management has spoken to about. Discuss with your doctor whether you should continue using birth control. Do not take aspirin until you follow up with your primary care physician. A deep vein thrombosis (DVT) is a blood clot that develops in the deep, larger veins of the leg, arm, or pelvis. These are more dangerous than clots that might form in veins near the surface of the body. A DVT can lead to serious and even life-threatening complications if the clot breaks off and travels in the bloodstream to the lungs.  A DVT can damage the valves in your leg veins so that instead of flowing upward, the blood pools in the lower leg. This is called post-thrombotic syndrome, and it can result in pain, swelling, discoloration, and sores on the leg. CAUSES Usually, several things contribute to the formation of blood clots. Contributing factors include:  The flow of blood slows down.  The inside of the vein is damaged in some way.  You have a condition that makes blood clot more easily. RISK FACTORS Some people are more likely than others to develop blood clots. Risk factors include:   Smoking.  Being overweight (obese).  Sitting or lying still for a long time. This includes long-distance travel, paralysis, or recovery from an illness or surgery. Other factors that increase risk are:   Older age, especially over 56 years of age.  Having a family history of blood clots or if you have already had a blot clot.  Having major or lengthy surgery. This is especially true for surgery on the hip, knee, or belly (abdomen). Hip surgery is particularly high risk.  Having a long, thin tube (catheter) placed inside a vein during a medical procedure.  Breaking a hip or leg.  Having cancer or cancer treatment.  Pregnancy and childbirth.  Hormone changes  make the blood clot more easily during pregnancy.  The fetus puts pressure on the veins of the pelvis.  There is a risk of injury to veins during delivery or a caesarean delivery. The risk is highest just after childbirth.  Medicines containing the female hormone estrogen. This includes birth control pills and hormone replacement therapy.  Other circulation or heart problems.  SIGNS AND SYMPTOMS When a clot forms, it can either partially or totally block the blood flow in that vein. Symptoms of a DVT can include:  Swelling of the leg or arm, especially if one side is much worse.  Warmth and redness of the leg or arm, especially if one side is much worse.  Pain in an arm or leg. If the clot is in the leg, symptoms may be more noticeable or worse when standing or walking. The symptoms of a DVT that has traveled to the lungs (pulmonary embolism, PE) usually start suddenly and include:  Shortness of breath.  Coughing.  Coughing up blood or blood-tinged mucus.  Chest pain. The chest pain is often worse with deep breaths.  Rapid heartbeat. Anyone with these symptoms should get emergency medical treatment right away. Do not wait to see if the symptoms will go away. Call your local emergency services (911 in the [place] if you have these symptoms. Do not drive yourself to the hospital. DIAGNOSIS If a DVT is suspected, your health care provider will take a full medical history and perform a physical exam. Tests that also may be required include:  Blood tests, including studies of the clotting properties of the blood.  Ultrasound to see if you have clots in your legs or lungs.  X-rays to show the flow of blood when dye is injected into the veins (venogram).  Studies of your lungs if you have any chest symptoms. PREVENTION  Exercise the legs regularly. Take a brisk 30-minute walk every day.  Maintain a weight that is appropriate for your height.  Avoid sitting or lying in bed for  long periods of time without moving your legs.  Women, particularly those over the age of 35 years, should consider the risks and benefits of taking estrogen medicines, including birth control pills.  Do not smoke, especially if you take estrogen medicines.  Long-distance travel can increase your risk of DVT. You should exercise your legs by walking or pumping the muscles every hour.  Many of the risk factors above relate to situations that exist with hospitalization, either for illness, injury, or elective surgery. Prevention may include medical and nonmedical measures.  Your health care provider will assess you for the need for venous thromboembolism prevention when you are admitted to the hospital. If you are having surgery, your surgeon will assess you the day of or day after surgery. TREATMENT Once identified, a DVT can be treated. It can also be prevented in some circumstances. Once you have had a DVT, you may be at increased risk for a DVT in the future. The most common treatment for DVT is blood-thinning (anticoagulant) medicine, which reduces the blood's tendency to clot. Anticoagulants can stop new blood clots from forming and stop old clots from growing. They cannot dissolve existing clots. Your body does this by itself over time. Anticoagulants can be given by mouth, through an IV tube, or by injection. Your health care provider will determine the best program for you. Other medicines or treatments that may be used are:  Heparin or related medicines (low molecular weight heparin) are often the first treatment for a blood clot. They act quickly. However, they cannot be taken orally and must be given either in shot form or by IV tube.  Heparin can cause a fall in a component of blood that stops bleeding and forms blood clots (platelets). You will be monitored with blood tests to be sure this does not occur.  Warfarin is an anticoagulant that can be swallowed. It takes a few days to start  working, so usually heparin or related medicines are used in combination. Once warfarin is working, heparin is usually stopped.  Factor Xa inhibitor medicines, such as rivaroxaban and apixaban, also reduce blood clotting. These medicines are taken orally and can often be used without heparin or related medicines.  Less commonly, clot dissolving drugs (thrombolytics) are used to dissolve a DVT. They carry a high risk of bleeding, so they are used mainly in severe cases where your life or a part of your body is threatened.  Very rarely, a blood clot in the leg needs to be removed surgically.  If you are unable to take anticoagulants, your health care provider may arrange for you to have a filter placed in a main vein in your abdomen. This filter prevents clots from traveling to your lungs. HOME CARE INSTRUCTIONS  Take all medicines as directed by your health care provider.  Learn as much as you can about DVT.  Wear a medical alert bracelet or carry a medical alert card.  Ask your health care provider how soon you can go back to  normal activities. It is important to stay active to prevent blood clots. If you are on anticoagulant medicine, avoid contact sports.  It is very important to exercise. This is especially important while traveling, sitting, or standing for long periods of time. Exercise your legs by walking or by tightening and relaxing your leg muscles regularly. Take frequent walks.  You may need to wear compression stockings. These are tight elastic stockings that apply pressure to the lower legs. This pressure can help keep the blood in the legs from clotting. Taking Warfarin Warfarin is a daily medicine that is taken by mouth. Your health care provider will advise you on the length of treatment (usually 3-6 months, sometimes lifelong). If you take warfarin:  Understand how to take warfarin and foods that can affect how warfarin works in Public relations account executive.  Too much and too little  warfarin are both dangerous. Too much warfarin increases the risk of bleeding. Too little warfarin continues to allow the risk for blood clots. Warfarin and Regular Blood Testing While taking warfarin, you will need to have regular blood tests to measure your blood clotting time. These blood tests usually include both the prothrombin time (PT) and international normalized ratio (INR) tests. The PT and INR results allow your health care provider to adjust your dose of warfarin. It is very important that you have your PT and INR tested as often as directed by your health care provider.  Warfarin and Your Diet Avoid major changes in your diet, or notify your health care provider before changing your diet. Arrange a visit with a registered dietitian to answer your questions. Many foods, especially foods high in vitamin K, can interfere with warfarin and affect the PT and INR results. You should eat a consistent amount of foods high in vitamin K. Foods high in vitamin K include:   Spinach, kale, broccoli, cabbage, collard and turnip greens, Brussels sprouts, peas, cauliflower, seaweed, and parsley.  Beef and pork liver.  Green tea.  Soybean oil. Warfarin with Other Medicines Many medicines can interfere with warfarin and affect the PT and INR results. You must:  Tell your health care provider about any and all medicines, vitamins, and supplements you take, including aspirin and other over-the-counter anti-inflammatory medicines. Be especially cautious with aspirin and anti-inflammatory medicines. Ask your health care provider before taking these.  Do not take or discontinue any prescribed or over-the-counter medicine except on the advice of your health care provider or pharmacist. Warfarin Side Effects Warfarin can have side effects, such as easy bruising and difficulty stopping bleeding. Ask your health care provider or pharmacist about other side effects of warfarin. You will need to:  Hold  pressure over cuts for longer than usual.  Notify your dentist and other health care providers that you are taking warfarin before you undergo any procedures where bleeding may occur. Warfarin with Alcohol and Tobacco   Drinking alcohol frequently can increase the effect of warfarin, leading to excess bleeding. It is best to avoid alcoholic drinks or to consume only very small amounts while taking warfarin. Notify your health care provider if you change your alcohol intake.   Do not use any tobacco products including cigarettes, chewing tobacco, or electronic cigarettes. If you smoke, quit. Ask your health care provider for help with quitting smoking. Alternative Medicines to Warfarin: Factor Xa Inhibitor Medicines  These blood-thinning medicines are taken by mouth, usually for several weeks or longer. It is important to take the medicine every single day at the  same time each day.  There are no regular blood tests required when using these medicines.  There are fewer food and drug interactions than with warfarin.  The side effects of this class of medicine are similar to those of warfarin, including excessive bruising or bleeding. Ask your health care provider or pharmacist about other potential side effects. SEEK MEDICAL CARE IF:  You notice a rapid heartbeat.  You feel weaker or more tired than usual.  You feel faint.  You notice increased bruising.  You feel your symptoms are not getting better in the time expected.  You believe you are having side effects of medicine. SEEK IMMEDIATE MEDICAL CARE IF:  You have chest pain.  You have trouble breathing.  You have new or increased swelling or pain in one leg.  You cough up blood.  You notice blood in vomit, in a bowel movement, or in urine. MAKE SURE YOU:  Understand these instructions.  Will watch your condition.  Will get help right away if you are not doing well or get worse. Document Released: 09/09/2005 Document  Revised: 01/24/2014 Document Reviewed: 05/17/2013 [facility] Patient Information 2015 [place], [place]. This information is not intended to replace advice given to you by your health care provider. Make sure you discuss any questions you have with your health care provider.  Strep Throat Strep throat is an infection of the throat. It is caused by a germ. Strep throat spreads from person to person by coughing, sneezing, or close contact. HOME CARE  Rinse your mouth (gargle) with warm salt water (1 teaspoon salt in 1 cup of water). Do this 3 to 4 times per day or as needed for comfort.  Family members with a sore throat or fever should see a doctor.  Make sure everyone in your house washes their hands well.  Do not share food, drinking cups, or personal items.  Eat soft foods until your sore throat gets better.  Drink enough water and fluids to keep your pee (urine) clear or pale yellow.  Rest.  Stay home from school, daycare, or work until you have taken medicine for 24 hours.  Only take medicine as told by your doctor.  Take your medicine as told. Finish it even if you start to feel better. GET HELP RIGHT AWAY IF:   You have new problems, such as throwing up (vomiting) or bad headaches.  You have a stiff or painful neck, chest pain, trouble breathing, or trouble swallowing.  You have very bad throat pain, drooling, or changes in your voice.  Your neck puffs up (swells) or gets red and tender.  You have a fever.  You are very tired, your mouth is dry, or you are peeing less than normal.  You cannot wake up completely.  You get a rash, cough, or earache.  You have green, yellow-brown, or bloody spit.  Your pain does not get better with medicine. MAKE SURE YOU:   Understand these instructions.  Will watch your condition.  Will get help right away if you are not doing well or get worse. Document Released: 02/26/2008 Document Revised: 12/02/2011 Document Reviewed:  11/08/2010 [facility] Patient Information 2015 [place], [place]. This information is not intended to replace advice given to you by your health care provider. Make sure you discuss any questions you have with your health care provider.

## 2015-05-06 NOTE — ED Notes (Signed)
Declined W/C at D/C and was escorted to lobby by RN. 

## 2015-05-07 ENCOUNTER — Encounter: Payer: Self-pay | Admitting: *Deleted

## 2015-05-07 ENCOUNTER — Telehealth: Payer: Self-pay | Admitting: *Deleted

## 2015-05-07 NOTE — Telephone Encounter (Signed)
Pt aware of appt and importance of f/u

## 2015-05-07 NOTE — Telephone Encounter (Signed)
Spoke with pt and gave her appt info.

## 2015-05-07 NOTE — Progress Notes (Signed)
Upon further discussion with Beecher Mcardle, CM,  f/U appt will be made for pt and communicated to pt by phone to ensure safe discharge plan.

## 2015-05-24 ENCOUNTER — Ambulatory Visit: Payer: Medicaid Other | Admitting: Cardiology

## 2015-07-19 ENCOUNTER — Ambulatory Visit: Payer: Medicaid Other | Attending: Cardiology | Admitting: Cardiology

## 2015-07-19 ENCOUNTER — Encounter: Payer: Self-pay | Admitting: Cardiology

## 2015-07-19 VITALS — BP 105/73 | HR 77 | Temp 98.3°F | Resp 18 | Ht 60.0 in | Wt 134.6 lb

## 2015-07-19 DIAGNOSIS — I82890 Acute embolism and thrombosis of other specified veins: Secondary | ICD-10-CM | POA: Diagnosis present

## 2015-07-19 DIAGNOSIS — Z79899 Other long term (current) drug therapy: Secondary | ICD-10-CM | POA: Diagnosis not present

## 2015-07-19 DIAGNOSIS — Z9114 Patient's other noncompliance with medication regimen: Secondary | ICD-10-CM | POA: Diagnosis not present

## 2015-07-19 DIAGNOSIS — I808 Phlebitis and thrombophlebitis of other sites: Secondary | ICD-10-CM | POA: Diagnosis not present

## 2015-07-19 DIAGNOSIS — I82A11 Acute embolism and thrombosis of right axillary vein: Secondary | ICD-10-CM

## 2015-07-19 DIAGNOSIS — D649 Anemia, unspecified: Secondary | ICD-10-CM | POA: Diagnosis not present

## 2015-07-19 HISTORY — DX: Acute embolism and thrombosis of right axillary vein: I82.A11

## 2015-07-19 MED ORDER — XARELTO VTE STARTER PACK 15 & 20 MG PO TBPK: 15.0000 mg | ORAL_TABLET | ORAL | Status: DC

## 2015-07-19 MED ORDER — RIVAROXABAN 20 MG PO TABS
20.0000 mg | ORAL_TABLET | Freq: Every day | ORAL | Status: DC
Start: 2015-07-19 — End: 2018-01-25
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
## 2015-07-19 NOTE — Patient Instructions (Addendum)
Thank you for visiting with Dr. Daleen SquibbWall today. Establish care with PCP Holland CommonsValerie Keck.

## 2015-07-19 NOTE — Assessment & Plan Note (Signed)
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 There is no code for this after perusal of the diagnostic categories. We have restarted her anti-thrombin and will continue for a total of 3 months. She reports no bleeding and no heavy menses. Will arrange for her to see primary care here in our clinic for her severe anemia and other medical problems. I will see her back after she finishes her 3 month course and draw a hypercoagulable panel. I have reinforced how important it is to be compliant with taking her meds as well as returning for visit. She understands that failure to do so could result in embolism which could be life-threatening.

## 2015-07-19 NOTE — Progress Notes (Signed)
Patient reports pain on right side of her neck, rated at a 6, described as sharp and numb. Patient reports on the right side of her neck it feels weak often. Pain is constant. Patient would like to have flu shot. Patient has been out of her xarelto for about 3 weeks now.

## 2015-07-28 ENCOUNTER — Ambulatory Visit: Payer: Medicaid Other | Admitting: Internal Medicine

## 2015-08-24 ENCOUNTER — Emergency Department (HOSPITAL_COMMUNITY): Payer: Medicaid Other

## 2015-08-24 ENCOUNTER — Encounter (HOSPITAL_COMMUNITY): Payer: Self-pay | Admitting: Neurology

## 2015-08-24 ENCOUNTER — Emergency Department (HOSPITAL_COMMUNITY)
Admission: EM | Admit: 2015-08-24 | Discharge: 2015-08-24 | Disposition: A | Discharge status: Home / Self Care | Payer: Medicaid Other | Attending: Emergency Medicine | Admitting: Emergency Medicine

## 2015-08-24 DIAGNOSIS — R079 Chest pain, unspecified: Secondary | ICD-10-CM | POA: Insufficient documentation

## 2015-08-24 DIAGNOSIS — Z91410 Personal history of adult physical and sexual abuse: Secondary | ICD-10-CM | POA: Diagnosis not present

## 2015-08-24 DIAGNOSIS — Z9889 Other specified postprocedural states: Secondary | ICD-10-CM | POA: Insufficient documentation

## 2015-08-24 DIAGNOSIS — N12 Tubulo-interstitial nephritis, not specified as acute or chronic: Secondary | ICD-10-CM | POA: Diagnosis not present

## 2015-08-24 DIAGNOSIS — Z8611 Personal history of tuberculosis: Secondary | ICD-10-CM | POA: Diagnosis not present

## 2015-08-24 DIAGNOSIS — Z79818 Long term (current) use of other agents affecting estrogen receptors and estrogen levels: Secondary | ICD-10-CM | POA: Diagnosis not present

## 2015-08-24 DIAGNOSIS — Z7901 Long term (current) use of anticoagulants: Secondary | ICD-10-CM | POA: Diagnosis not present

## 2015-08-24 DIAGNOSIS — R109 Unspecified abdominal pain: Secondary | ICD-10-CM | POA: Diagnosis present

## 2015-08-24 LAB — COMPREHENSIVE METABOLIC PANEL
ALBUMIN: 3.9 g/dL (ref 3.5–5.0)
ALK PHOS: 65 U/L (ref 38–126)
ALT: 25 U/L (ref 14–54)
AST: 25 U/L (ref 15–41)
Anion gap: 10 (ref 5–15)
BILIRUBIN TOTAL: 0.8 mg/dL (ref 0.3–1.2)
BUN: 5 mg/dL — AB (ref 6–20)
CALCIUM: 9.2 mg/dL (ref 8.9–10.3)
CO2: 21 mmol/L — ABNORMAL LOW (ref 22–32)
Chloride: 103 mmol/L (ref 101–111)
Creatinine, Ser: 0.94 mg/dL (ref 0.44–1.00)
GFR calc Af Amer: >60 mL/min (ref >60)
GFR calc non Af Amer: >60 mL/min (ref >60)
GLUCOSE: 138 mg/dL — AB (ref 65–99)
Potassium: 3.4 mmol/L — ABNORMAL LOW (ref 3.5–5.1)
Sodium: 134 mmol/L — ABNORMAL LOW (ref 135–145)
TOTAL PROTEIN: 7.2 g/dL (ref 6.5–8.1)

## 2015-08-24 LAB — URINE MICROSCOPIC-ADD ON

## 2015-08-24 LAB — CBC
HCT: 37.9 % (ref 36.0–46.0)
Hemoglobin: 13.1 g/dL (ref 12.0–15.0)
MCH: 30.7 pg (ref 26.0–34.0)
MCHC: 34.6 g/dL (ref 30.0–36.0)
MCV: 88.8 fL (ref 78.0–100.0)
Platelets: 216 10*3/uL (ref 150–400)
RBC: 4.27 MIL/uL (ref 3.87–5.11)
RDW: 12.1 % (ref 11.5–15.5)
WBC: 10.7 10*3/uL — ABNORMAL HIGH (ref 4.0–10.5)

## 2015-08-24 LAB — I-STAT CG4 LACTIC ACID, ED: Lactic Acid, Venous: 2.53 mmol/L (ref 0.5–2.0)

## 2015-08-24 LAB — URINALYSIS, ROUTINE W REFLEX MICROSCOPIC
BILIRUBIN URINE: NEGATIVE
Glucose, UA: NEGATIVE mg/dL
KETONES UR: NEGATIVE mg/dL
NITRITE: POSITIVE — AB
PH: 6 (ref 5.0–8.0)
Protein, ur: NEGATIVE mg/dL
Specific Gravity, Urine: 1.006 (ref 1.005–1.030)

## 2015-08-24 LAB — I-STAT TROPONIN, ED: Troponin i, poc: 0 ng/mL (ref 0.00–0.08)

## 2015-08-24 LAB — I-STAT BETA HCG BLOOD, ED (MC, WL, AP ONLY)

## 2015-08-24 LAB — LIPASE, BLOOD: Lipase: 22 U/L (ref 11–51)

## 2015-08-24 MED ORDER — HYDROMORPHONE HCL 1 MG/ML IJ SOLN
1.0000 mg | Freq: Once | INTRAMUSCULAR | Status: AC
  Administered 2015-08-24: 1 mg via INTRAVENOUS
  Filled 2015-08-24: qty 1

## 2015-08-24 MED ORDER — IOHEXOL 300 MG/ML  SOLN
80.0000 mL | Freq: Once | INTRAMUSCULAR | Status: AC | PRN
  Administered 2015-08-24: 80 mL via INTRAVENOUS

## 2015-08-24 MED ORDER — PROMETHAZINE HCL 25 MG PO TABS: 25.0000 mg | ORAL_TABLET | Freq: Three times a day (TID) | ORAL | Status: DC | PRN

## 2015-08-24 MED ORDER — ACETAMINOPHEN 325 MG PO TABS
650.0000 mg | ORAL_TABLET | Freq: Once | ORAL | Status: AC
  Administered 2015-08-24: 650 mg via ORAL

## 2015-08-24 MED ORDER — IBUPROFEN 800 MG PO TABS
800.0000 mg | ORAL_TABLET | Freq: Once | ORAL | Status: AC
  Administered 2015-08-24: 800 mg via ORAL

## 2015-08-24 MED ORDER — HYDROCODONE-ACETAMINOPHEN 5-325 MG PO TABS: 1.0000 | ORAL_TABLET | Freq: Four times a day (QID) | ORAL | Status: DC | PRN

## 2015-08-24 MED ORDER — ONDANSETRON HCL 4 MG/2ML IJ SOLN
4.0000 mg | Freq: Once | INTRAMUSCULAR | Status: AC
  Administered 2015-08-24: 4 mg via INTRAVENOUS
  Filled 2015-08-24: qty 2

## 2015-08-24 MED ORDER — MORPHINE SULFATE (PF) 4 MG/ML IV SOLN
4.0000 mg | Freq: Once | INTRAVENOUS | Status: AC
  Administered 2015-08-24: 4 mg via INTRAVENOUS
  Filled 2015-08-24: qty 1

## 2015-08-24 MED ORDER — SODIUM CHLORIDE 0.9 % IV BOLUS (SEPSIS)
1000.0000 mL | Freq: Once | INTRAVENOUS | Status: AC
  Administered 2015-08-24: 1000 mL via INTRAVENOUS

## 2015-08-24 MED ORDER — ACETAMINOPHEN 325 MG PO TABS
650.0000 mg | ORAL_TABLET | Freq: Once | ORAL | Status: AC | PRN
  Administered 2015-08-24: 650 mg via ORAL

## 2015-08-24 MED ORDER — CIPROFLOXACIN IN D5W 400 MG/200ML IV SOLN
400.0000 mg | Freq: Once | INTRAVENOUS | Status: AC
  Administered 2015-08-24: 400 mg via INTRAVENOUS
  Filled 2015-08-24: qty 200

## 2015-08-24 MED ORDER — ONDANSETRON 4 MG PO TBDP
4.0000 mg | ORAL_TABLET | Freq: Once | ORAL | Status: AC
  Administered 2015-08-24: 4 mg via ORAL
  Filled 2015-08-24: qty 1

## 2015-08-24 MED ORDER — IOHEXOL 300 MG/ML  SOLN: 25.0000 mL | Freq: Once | INTRAMUSCULAR | Status: DC | PRN

## 2015-08-24 MED ORDER — ACETAMINOPHEN 325 MG PO TABS
ORAL_TABLET | ORAL | Status: AC
  Filled 2015-08-24: qty 2

## 2015-08-24 MED ORDER — CIPROFLOXACIN HCL 500 MG PO TABS: 500.0000 mg | ORAL_TABLET | Freq: Two times a day (BID) | ORAL | Status: DC

## 2015-08-24 NOTE — ED Notes (Signed)
Notified PA of patient's pain.

## 2015-08-24 NOTE — Discharge Instructions (Signed)
Return here as needed.  Follow-up with a primary care Dr. increase her fluid intake, Tylenol and Motrin for fever

## 2015-08-24 NOTE — ED Notes (Signed)
Off unit with CT 

## 2015-08-24 NOTE — ED Notes (Signed)
Pt reports central cp for several months, was dx with blood clot in her neck. Takes xarelto. Has been having RLQ abd pain for 2 weeks. Denies n/v/d. Denies cough. No urinary s/s. Feels sob all the time but feels sob all the time.
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
## 2015-08-24 NOTE — ED Notes (Signed)
Patient left at this time with all belongings. 

## 2015-08-24 NOTE — ED Notes (Signed)
Pt ambulated to the restroom for urine sample in a steady gait.

## 2015-08-24 NOTE — ED Notes (Signed)
Pt returned from CT °

## 2015-08-26 LAB — URINE CULTURE: Culture: >=100000

## 2015-08-28 ENCOUNTER — Telehealth: Payer: Self-pay | Admitting: *Deleted

## 2015-08-28 NOTE — ED Notes (Signed)
(+)  urine culture, no further treatment indicated per Felicita GageJ. Geiple, PA

## 2015-08-31 ENCOUNTER — Inpatient Hospital Stay: Payer: Medicaid Other | Admitting: Family Medicine

## 2016-02-13 ENCOUNTER — Ambulatory Visit (HOSPITAL_COMMUNITY)
Admission: RE | Admit: 2016-02-13 | Discharge: 2016-02-13 | Disposition: A | Discharge status: Home / Self Care | Payer: Medicaid Other | Source: Ambulatory Visit | Attending: Internal Medicine | Admitting: Internal Medicine

## 2016-02-13 ENCOUNTER — Ambulatory Visit (HOSPITAL_BASED_OUTPATIENT_CLINIC_OR_DEPARTMENT_OTHER): Payer: Medicaid Other | Admitting: Internal Medicine

## 2016-02-13 ENCOUNTER — Encounter: Payer: Self-pay | Admitting: Internal Medicine

## 2016-02-13 ENCOUNTER — Telehealth: Payer: Self-pay | Admitting: Internal Medicine

## 2016-02-13 VITALS — BP 104/71 | HR 84 | Temp 98.6°F | Wt 134.4 lb

## 2016-02-13 DIAGNOSIS — M542 Cervicalgia: Secondary | ICD-10-CM

## 2016-02-13 DIAGNOSIS — Z7901 Long term (current) use of anticoagulants: Secondary | ICD-10-CM | POA: Diagnosis not present

## 2016-02-13 DIAGNOSIS — Z0000 Encounter for general adult medical examination without abnormal findings: Secondary | ICD-10-CM

## 2016-02-13 DIAGNOSIS — Z79899 Other long term (current) drug therapy: Secondary | ICD-10-CM | POA: Diagnosis not present

## 2016-02-13 DIAGNOSIS — R200 Anesthesia of skin: Secondary | ICD-10-CM | POA: Diagnosis not present

## 2016-02-13 DIAGNOSIS — I808 Phlebitis and thrombophlebitis of other sites: Secondary | ICD-10-CM

## 2016-02-13 DIAGNOSIS — I82A11 Acute embolism and thrombosis of right axillary vein: Secondary | ICD-10-CM

## 2016-02-13 DIAGNOSIS — Z91410 Personal history of adult physical and sexual abuse: Secondary | ICD-10-CM | POA: Diagnosis not present

## 2016-02-13 DIAGNOSIS — M79609 Pain in unspecified limb: Secondary | ICD-10-CM | POA: Insufficient documentation
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
## 2016-02-13 NOTE — Progress Notes (Signed)
VASCULAR LAB PRELIMINARY  PRELIMINARY  PRELIMINARY  PRELIMINARY  Right upper extremity venous duplex completed.    Right upper extremity:  No evidence of DVT or superficial thrombosis.    Call Dr. Isidore Moosffice @11 :40 am  Jenetta Logesami Joydan Gretzinger, RVT, RDMS 02/13/2016, 11:37 AM

## 2016-02-13 NOTE — Patient Instructions (Signed)
Exercising to Stay Healthy Exercising regularly is important. It has many health benefits, such as:  Improving your overall fitness, flexibility, and endurance.  Increasing your bone density.  Helping with weight control.  Decreasing your body fat.  Increasing your muscle strength.  Reducing stress and tension.  Improving your overall health. In order to become healthy and stay healthy, it is recommended that you do moderate-intensity and vigorous-intensity exercise. You can tell that you are exercising at a moderate intensity if you have a higher heart rate and faster breathing, but you are still able to hold a conversation. You can tell that you are exercising at a vigorous intensity if you are breathing much harder and faster and cannot hold a conversation while exercising. HOW OFTEN SHOULD I EXERCISE? Choose an activity that you enjoy and set realistic goals. Your health care provider can help you to make an activity plan that works for you. Exercise regularly as directed by your health care provider. This may include:   Doing resistance training twice each week, such as:  Push-ups.  Sit-ups.  Lifting weights.  Using resistance bands.  Doing a given intensity of exercise for a given amount of time. Choose from these options:  150 minutes of moderate-intensity exercise every week.  75 minutes of vigorous-intensity exercise every week.  A mix of moderate-intensity and vigorous-intensity exercise every week. Children, pregnant women, people who are out of shape, people who are overweight, and older adults may need to consult a health care provider for individual recommendations. If you have any sort of medical condition, be sure to consult your health care provider before starting a new exercise program.  WHAT ARE SOME EXERCISE IDEAS? Some moderate-intensity exercise ideas include:   Walking at a rate of 1 mile in 15  minutes.  Biking.  Hiking.  Golfing.  Dancing. Some vigorous-intensity exercise ideas include:   Walking at a rate of at least 4.5 miles per hour.  Jogging or running at a rate of 5 miles per hour.  Biking at a rate of at least 10 miles per hour.  Lap swimming.  Roller-skating or in-line skating.  Cross-country skiing.  Vigorous competitive sports, such as football, basketball, and soccer.  Jumping rope.  Aerobic dancing. WHAT ARE SOME EVERYDAY ACTIVITIES THAT CAN HELP ME TO GET EXERCISE?  Yard work, such as:  Child psychotherapistushing a lawn mower.  Raking and bagging leaves.  Washing and waxing your car.  Pushing a stroller.  Shoveling snow.  Gardening.  Washing windows or floors. HOW CAN I BE MORE ACTIVE IN MY DAY-TO-DAY ACTIVITIES?  Use the stairs instead of the elevator.  Take a walk during your lunch break.  If you drive, park your car farther away from work or school.  If you take public transportation, get off one stop early and   walk the rest of the way.  Make all of your phone calls while standing up and walking around.  Get up, stretch, and walk around every 30 minutes throughout the day. WHAT GUIDELINES SHOULD I FOLLOW WHILE EXERCISING?  Do not exercise so much that you hurt yourself, feel dizzy, or get very short of breath.  Consult your health care provider before starting a new exercise program.  Wear comfortable clothes and shoes with good support.  Drink plenty of water while you exercise to prevent dehydration or heat stroke. Body water is lost during exercise and must be replaced.  Work out until you breathe faster and your heart beats faster.  This information is not intended to replace advice given to you by your health care provider. Make sure you discuss any questions you have with your health care provider.   Document Released: 10/12/2010 Document Revised: 09/30/2014 Document Reviewed: 02/10/2014 Elsevier Interactive Patient Education  2016 [employer].  - Eating Healthy on a Budget There are many ways to save money at the grocery store and continue to eat healthy. You can be successful if you plan your meals according to your budget, purchase according to your budget and grocery list, and prepare food yourself.  How can I buy more food on a limited budget? Plan  Plan meals and snacks according to a grocery list and budget you create.  Look for recipes where you can cook once and make enough food for two meals.  Include meals that will "stretch" more expensive foods such as stews, casseroles, and stir-fry dishes.  Make a grocery list and make sure to bring it with you to the store. If you have a smart phone, you could use your phone to create your shopping list. Purchase  When grocery shopping, buy only the items on your grocery list and go only to the areas of the store that have the items on your list. Prepare  Some meal items can be prepared in advance. Pre-cook on days when you have extra time.  Make extra food (such as by doubling recipes) and freeze the extras in meal-sized containers or in individual portions for fast meals and snacks.  Use leftovers in your meal plan for the week.  Try some meatless meals or try "no cook" meals like salads.  When you come home from the grocery store, wash and prepare your fruits and vegetables so they are ready to use and eat. This will help reduce food waste. How can I buy more food on a limited budget?  Try these tips the next time you go shopping:   [place] store brands or generic brands.  Use coupons only for foods and brands you normally buy. Avoid buying items you wouldn't normally buy simply because they are on sale.  Check online and in newspapers for weekly deals.  Buy healthy items from the bulk bins when available, such as herbs, spices, flours, pastas, nuts, and dried fruit.  Buy fruits and vegetables that are in season. Prices are usually lower on  in-season produce.  Compare and contrast different items. You can do this by looking at the unit price on the price tag. Use it to compare different brands and sizes to find out which item is the best deal.  Choose naturally low-cost healthy items, such as carrots, potatoes, apples, bananas, and oranges. Dried or canned beans are a low-cost protein source.  Buy in bulk and freeze extra food. Items you can buy in bulk include meats, fish, poultry, frozen fruits, and frozen vegetables.  Limit the purchase of prepared or "ready-to-eat" foods, such as pre-cut fruits and vegetables and pre-made salads.  If possible, shop around to discover which grocery store offers the best prices. Some stores charge much more than other stores for the same items.  Do not shop when you are hungry. If you shop while hungry, It may be hard to stick to your list and budget.  Stick to your list and resist impulse buys. Treat your list as your official plan for the week.  Buy a variety of vegetables and fruit by purchasing fresh, frozen, and canned items.  Look beyond eye level. Foods at  eye level (adult or child eye level) are more expensive. Look at the top and bottom shelves for deals.  Be efficient with your time when shopping. The more time you spend at the store, the more money you are likely to spend.  Consider other retailers such as dollar stores, larger [employer], local fruit and vegetable stands, and farmers markets. What are some tips for less expensive food substitutions? When choosing more expensive foods like meats and dairy, try these tips to save money:  Choose cheaper cuts of meat, such as bone-in chicken thighs and drumsticks instead skinless and boneless chicken. When you are ready to prepare the chicken, you can remove the skin yourself to make it healthier.  Choose lean meats like chicken or [place]. When choosing ground beef, make sure it is lean ground beef (92% lean, 8% fat). If you  do buy a fattier ground beef, drain the fat before eating.  Buy dried beans and peas, such as lentils, split peas, or kidney beans.  For seafood, choose canned tuna, salmon, or sardines.  Eggs are a low-cost source of protein.  Buy the larger tubs of yogurt instead of individual-sized containers.  Choose water instead of sodas and other sweetened beverages.  Skip buying chips, cookies, and other "junk food". These items are usually expensive, high in calories, and low in nutritional value. How can I prepare the foods I buy in the healthiest way? Practice these tips for cooking foods in the healthiest way to reduce excess fat and calorie intake:  Steam, saute, grill, or bake foods instead of frying them.  Make sure half your plate is filled with fruits or vegetables. Choose from fresh, frozen, or canned fruits and vegetables. If eating canned, remember to rinse them before eating. This will remove any excess salt added for packaging.  Trim all fat from meat before cooking. Remove the skin from chicken or [place].  Spoon off fat from meat dishes once they have been chilled in the refrigerator and the fat has hardened on the top.  Use skim milk, low-fat milk, or evaporated skim milk when making cream sauces, soups, or puddings.  Substitute low-fat yogurt, sour cream, or cottage cheese for sour cream and mayonnaise in dips and dressings.  Try lemon juice, herbs, or spices to season food instead of salt, butter, or margarine.   This information is not intended to replace advice given to you by your health care provider. Make sure you discuss any questions you have with your health care provider.   Document Released: 05/13/2014 Document Reviewed: 05/13/2014 Elsevier Interactive Patient Education [employer]

## 2016-02-13 NOTE — Telephone Encounter (Signed)
Pt returned cll - advsd of US results. Pt stated she understood.

## 2016-02-13 NOTE — Telephone Encounter (Signed)
Please try calling pt again and tell her her US was neg for neck dvt. thanks  443pm: I tried to call pt for dvt us result.  No pickup, left vm to call us.

## 2016-02-14 ENCOUNTER — Other Ambulatory Visit: Payer: Self-pay | Admitting: Internal Medicine

## 2016-02-14 LAB — PREGNANCY, URINE: PREG TEST UR: NEGATIVE

## 2016-02-15 LAB — PREGNANCY, URINE: PREG TEST UR: NEGATIVE

## 2016-03-19 ENCOUNTER — Encounter: Payer: Self-pay | Admitting: Internal Medicine

## 2016-03-19 ENCOUNTER — Other Ambulatory Visit (HOSPITAL_COMMUNITY)
Admission: RE | Admit: 2016-03-19 | Discharge: 2016-03-19 | Disposition: A | Discharge status: Home / Self Care | Payer: Medicaid Other | Source: Ambulatory Visit | Attending: Internal Medicine | Admitting: Internal Medicine

## 2016-03-19 ENCOUNTER — Ambulatory Visit: Payer: Medicaid Other | Attending: Internal Medicine | Admitting: Internal Medicine

## 2016-03-19 VITALS — BP 121/81 | HR 60 | Temp 99.0°F | Wt 130.0 lb

## 2016-03-19 DIAGNOSIS — Z131 Encounter for screening for diabetes mellitus: Secondary | ICD-10-CM | POA: Diagnosis not present

## 2016-03-19 DIAGNOSIS — Z7901 Long term (current) use of anticoagulants: Secondary | ICD-10-CM | POA: Insufficient documentation

## 2016-03-19 DIAGNOSIS — Z79899 Other long term (current) drug therapy: Secondary | ICD-10-CM | POA: Insufficient documentation

## 2016-03-19 DIAGNOSIS — I808 Phlebitis and thrombophlebitis of other sites: Secondary | ICD-10-CM | POA: Diagnosis not present

## 2016-03-19 DIAGNOSIS — Z124 Encounter for screening for malignant neoplasm of cervix: Secondary | ICD-10-CM

## 2016-03-19 DIAGNOSIS — I82A11 Acute embolism and thrombosis of right axillary vein: Secondary | ICD-10-CM | POA: Insufficient documentation

## 2016-03-19 DIAGNOSIS — Z1329 Encounter for screening for other suspected endocrine disorder: Secondary | ICD-10-CM

## 2016-03-19 DIAGNOSIS — Z01419 Encounter for gynecological examination (general) (routine) without abnormal findings: Secondary | ICD-10-CM | POA: Insufficient documentation

## 2016-03-19 DIAGNOSIS — Z113 Encounter for screening for infections with a predominantly sexual mode of transmission: Secondary | ICD-10-CM | POA: Insufficient documentation
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
## 2016-03-19 NOTE — Patient Instructions (Signed)
Heart-Healthy Eating Plan °Heart-healthy meal planning includes: °· Limiting unhealthy fats. °· Increasing healthy fats. °· Making other small dietary changes. °You may need to talk with your doctor or a diet specialist (dietitian) to create an eating plan that is right for you. °WHAT TYPES OF FAT SHOULD I CHOOSE? °· Choose healthy fats. These include olive oil and canola oil, flaxseeds, walnuts, almonds, and seeds. °· Eat more omega-3 fats. These include salmon, mackerel, sardines, tuna, flaxseed oil, and ground flaxseeds. Try to eat fish at least twice each week. °· Limit saturated fats. °¨ Saturated fats are often found in animal products, such as meats, butter, and cream. °¨ Plant sources of saturated fats include palm oil, palm kernel oil, and coconut oil. °· Avoid foods with partially hydrogenated oils in them. These include stick margarine, some tub margarines, cookies, crackers, and other baked goods. These contain trans fats. °WHAT GENERAL GUIDELINES DO I NEED TO FOLLOW? °· Check food labels carefully. Identify foods with trans fats or high amounts of saturated fat. °· Fill one half of your plate with vegetables and green salads. Eat 4-5 servings of vegetables per day. A serving of vegetables is: °¨ 1 cup of raw leafy vegetables. °¨ ½ cup of raw or cooked cut-up vegetables. °¨ ½ cup of vegetable juice. °· Fill one fourth of your plate with whole grains. Look for the word "whole" as the first word in the ingredient list. °· Fill one fourth of your plate with lean protein foods. °· Eat 4-5 servings of fruit per day. A serving of fruit is: °¨ One medium whole fruit. °¨ ¼ cup of dried fruit. °¨ ½ cup of fresh, frozen, or canned fruit. °¨ ½ cup of 100% fruit juice. °· Eat more foods that contain soluble fiber. These include apples, broccoli, carrots, beans, peas, and barley. Try to get 20-30 g of fiber per day. °· Eat more home-cooked food. Eat less restaurant, buffet, and fast food. °· Limit or avoid  alcohol. °· Limit foods high in starch and sugar. °· Avoid fried foods. °· Avoid frying your food. Try baking, boiling, grilling, or broiling it instead. You can also reduce fat by: °¨ Removing the skin from poultry. °¨ Removing all visible fats from meats. °¨ Skimming the fat off of stews, soups, and gravies before serving them. °¨ Steaming vegetables in water or broth. °· Lose weight if you are overweight. °· Eat 4-5 servings of nuts, legumes, and seeds per week: °¨ One serving of dried beans or legumes equals ½ cup after being cooked. °¨ One serving of nuts equals 1½ ounces. °¨ One serving of seeds equals ½ ounce or one tablespoon. °· You may need to keep track of how much salt or sodium you eat. This is especially true if you have high blood pressure. Talk with your doctor or dietitian to get more information. °WHAT FOODS CAN I EAT? °Grains °Breads, including French, white, pita, wheat, raisin, rye, oatmeal, and Italian. Tortillas that are neither fried nor made with lard or trans fat. Low-fat rolls, including hotdog and hamburger buns and English muffins. Biscuits. Muffins. Waffles. Pancakes. Light popcorn. Whole-grain cereals. Flatbread. Melba toast. Pretzels. Breadsticks. Rusks. Low-fat snacks. Low-fat crackers, including oyster, saltine, matzo, graham, animal, and rye. Rice and pasta, including brown rice and pastas that are made with whole wheat.  °Vegetables °All vegetables.  °Fruits °All fruits, but limit coconut. °Meats and Other Protein Sources °Lean, well-trimmed beef, veal, pork, and lamb. Chicken and turkey without skin. All fish and   shellfish. Wild duck, rabbit, pheasant, and venison. Egg whites or low-cholesterol egg substitutes. Dried beans, peas, lentils, and tofu. Seeds and most nuts. °Dairy °Low-fat or nonfat cheeses, including ricotta, string, and mozzarella. Skim or 1% milk that is liquid, powdered, or evaporated. Buttermilk that is made with low-fat milk. Nonfat or low-fat  yogurt. °Beverages °Mineral water. Diet carbonated beverages. °Sweets and Desserts °Sherbets and fruit ices. Honey, jam, marmalade, jelly, and syrups. Meringues and gelatins. Pure sugar candy, such as hard candy, jelly beans, gumdrops, mints, marshmallows, and small amounts of dark chocolate. Angel food cake. °Eat all sweets and desserts in moderation. °Fats and Oils °Nonhydrogenated (trans-free) margarines. Vegetable oils, including soybean, sesame, sunflower, olive, peanut, safflower, corn, canola, and cottonseed. Salad dressings or mayonnaise made with a vegetable oil. Limit added fats and oils that you use for cooking, baking, salads, and as spreads. °Other °Cocoa powder. Coffee and tea. All seasonings and condiments. °The items listed above may not be a complete list of recommended foods or beverages. Contact your dietitian for more options. °WHAT FOODS ARE NOT RECOMMENDED? °Grains °Breads that are made with saturated or trans fats, oils, or whole milk. Croissants. Butter rolls. Cheese breads. Sweet rolls. Donuts. Buttered popcorn. Chow mein noodles. High-fat crackers, such as cheese or butter crackers. °Meats and Other Protein Sources °Fatty meats, such as hotdogs, short ribs, sausage, spareribs, bacon, rib eye roast or steak, and mutton. High-fat deli meats, such as salami and bologna. Caviar. Domestic duck and goose. Organ meats, such as kidney, liver, sweetbreads, and heart. °Dairy °Cream, sour cream, cream cheese, and creamed cottage cheese. Whole-milk cheeses, including blue (bleu), Monterey Jack, Brie, [place], American, Havarti, Swiss, cheddar, Camembert, and [place]. Whole or 2% milk that is liquid, evaporated, or condensed. Whole buttermilk. Cream sauce or high-fat cheese sauce. Yogurt that is made from whole milk. °Beverages °Regular sodas and juice drinks with added sugar. °Sweets and Desserts °Frosting. Pudding. Cookies. Cakes other than angel food cake. Candy that has milk chocolate or white  chocolate, hydrogenated fat, butter, coconut, or unknown ingredients. Buttered syrups. Full-fat ice cream or ice cream drinks. °Fats and Oils °Gravy that has suet, meat fat, or shortening. Cocoa butter, hydrogenated oils, palm oil, coconut oil, palm kernel oil. These can often be found in baked products, candy, fried foods, nondairy creamers, and whipped toppings. Solid fats and shortenings, including bacon fat, salt pork, lard, and butter. Nondairy cream substitutes, such as coffee creamers and sour cream substitutes. Salad dressings that are made of unknown oils, cheese, or sour cream. °The items listed above may not be a complete list of foods and beverages to avoid. Contact your dietitian for more information. °  °This information is not intended to replace advice given to you by your health care provider. Make sure you discuss any questions you have with your health care provider. °  °Document Released: 03/10/2012 Document Revised: 09/30/2014 Document Reviewed: 03/03/2014 °Elsevier Interactive Patient Education ©2016 Elsevier Inc. ° °

## 2016-03-20 ENCOUNTER — Other Ambulatory Visit: Payer: Self-pay | Admitting: Internal Medicine

## 2016-03-20 LAB — BASIC METABOLIC PANEL WITH GFR
BUN: 7 mg/dL (ref 7–25)
CO2: 21 mmol/L (ref 20–31)
Calcium: 9.6 mg/dL (ref 8.6–10.2)
Chloride: 107 mmol/L (ref 98–110)
Creat: 0.76 mg/dL (ref 0.50–1.10)
GFR, Est African American: >89 mL/min (ref >=60)
GFR, Est Non African American: >89 mL/min (ref >=60)
Glucose, Bld: 71 mg/dL (ref 65–99)
Potassium: 4 mmol/L (ref 3.5–5.3)
Sodium: 140 mmol/L (ref 135–146)

## 2016-03-20 LAB — CBC WITH DIFFERENTIAL/PLATELET
BASOS ABS: 0 {cells}/uL (ref 0–200)
Basophils Relative: 0 %
EOS ABS: 66 {cells}/uL (ref 15–500)
Eosinophils Relative: 1 %
HEMATOCRIT: 41.1 % (ref 35.0–45.0)
HEMOGLOBIN: 14.1 g/dL (ref 11.7–15.5)
Lymphocytes Relative: 43 %
Lymphs Abs: 2838 cells/uL (ref 850–3900)
MCH: 30.7 pg (ref 27.0–33.0)
MCHC: 34.3 g/dL (ref 32.0–36.0)
MCV: 89.5 fL (ref 80.0–100.0)
MONO ABS: 396 {cells}/uL (ref 200–950)
MONOS PCT: 6 %
MPV: 10.3 fL (ref 7.5–12.5)
NEUTROS ABS: 3300 {cells}/uL (ref 1500–7800)
Neutrophils Relative %: 50 %
PLATELETS: 253 10*3/uL (ref 140–400)
RBC: 4.59 MIL/uL (ref 3.80–5.10)
RDW: 13.3 % (ref 11.0–15.0)
WBC: 6.6 10*3/uL (ref 3.8–10.8)

## 2016-03-20 LAB — CYTOLOGY - PAP

## 2016-03-20 LAB — CERVICOVAGINAL ANCILLARY ONLY: WET PREP (BD AFFIRM): POSITIVE — AB

## 2016-03-20 LAB — TSH: TSH: 1.94 mIU/L

## 2016-03-20 MED ORDER — METRONIDAZOLE 500 MG PO TABS: 500.0000 mg | ORAL_TABLET | Freq: Two times a day (BID) | ORAL | Status: DC

## 2016-03-22 LAB — CERVICOVAGINAL ANCILLARY ONLY: Herpes: NEGATIVE

## 2016-03-23 LAB — ANTIPHOSPHOLIPID SYNDROME DIAGNOSTIC PANEL
Beta-2 Glyco I IgG: <9 SGU (ref <=20)
Beta-2-Glycoprotein I IgM: <9 SMU (ref <=20)

## 2016-03-23 LAB — RFX DRVVT SCR W/RFLX CONF 1:1 MIX: DRVVT SCREEN: 33 s (ref <=45)

## 2016-03-23 LAB — RFX PTT-LA W/RFX TO HEX PHASE CONF: PTT-LA SCREEN: 39 s (ref <=40)

## 2016-03-24 LAB — PROTEIN C, TOTAL AND FUNCTIONAL PANEL
Protein C Activity: 78 % (ref 70–180)
Protein C Antigen: 89 % (ref 70–140)

## 2016-03-24 LAB — PROTEIN S, TOTAL AND FUNCTIONAL PANEL
PROTEIN S ACTIVITY: 81 % (ref 60–140)
PROTEIN S ANTIGEN, TOTAL: 79 % (ref 70–140)

## 2016-03-24 LAB — ANTITHROMBIN III: AntiThromb III Func: 106 % activity (ref 80–120)

## 2016-03-26 LAB — FACTOR 5 LEIDEN

## 2016-03-26 LAB — PROTHROMBIN GENE MUTATION

## 2016-04-23 ENCOUNTER — Telehealth: Payer: Self-pay

## 2016-04-23 NOTE — Telephone Encounter (Signed)
Clld pt - LMOVMTC re lab results. Will mail lab results since this is the 3rd time truing to contact patient.

## 2016-04-23 NOTE — Telephone Encounter (Signed)
-----   Message from Pete Glatter, MD sent at 03/26/2016 11:50 PM EDT ----- Please call pt w/ results. All her hypercoaguble workup/genetic testing was negative.  Currently would not recommend going back to blood thinners unless she had another clot. If she had another blood clot, than we would than have to keep her on blood thinners for life.    Kidney function good. Blood count normal. Neg herpes testing.  Her pap smear was also normal. Will need next papsmear in 3 years.   - need to inform her of BV +/pick up flagyl rx.  Thank.

## 2016-04-23 NOTE — Telephone Encounter (Signed)
Clld pt  - LMOVMTC re lab results. Will go ahead and mail lab results since this is the 3rd time trying to reach pt.

## 2016-04-23 NOTE — Telephone Encounter (Signed)
-----   Message from Pete Glatter, MD sent at 03/20/2016 12:01 PM EDT ----- Please call, rest of labs still penidng, but +bv/gardnerella on testing. rx for flagyl bid x 7days ordered. thanks

## 2016-06-18 ENCOUNTER — Encounter: Payer: Self-pay | Admitting: *Deleted

## 2016-11-20 ENCOUNTER — Ambulatory Visit: Payer: Medicaid Other | Admitting: Obstetrics

## 2016-11-20 ENCOUNTER — Encounter: Payer: Self-pay | Admitting: Obstetrics

## 2016-11-20 NOTE — Progress Notes (Signed)
Pt presents for Nexplanon removal expires 10/30/16. Pt changed mind and request to wait until Nexplanon arrives before removing the implant.

## 2016-11-26 ENCOUNTER — Telehealth: Payer: Self-pay

## 2016-11-26 NOTE — Telephone Encounter (Signed)
Returned [facility] [place] [facility] [phone] ext: [phone], no answer, no option to leave vm.

## 2016-12-12 ENCOUNTER — Encounter: Payer: Self-pay | Admitting: *Deleted

## 2016-12-12 ENCOUNTER — Encounter: Payer: Self-pay | Admitting: Obstetrics

## 2016-12-12 ENCOUNTER — Ambulatory Visit: Payer: Medicaid Other | Admitting: Obstetrics

## 2016-12-12 VITALS — BP 110/77 | HR 85 | Wt 129.0 lb

## 2016-12-12 DIAGNOSIS — Z3046 Encounter for surveillance of implantable subdermal contraceptive: Secondary | ICD-10-CM | POA: Diagnosis not present

## 2016-12-12 MED ORDER — ETONOGESTREL 68 MG ~~LOC~~ IMPL
68.0000 mg | DRUG_IMPLANT | Freq: Once | SUBCUTANEOUS | Status: AC
  Administered 2016-12-12: 68 mg via SUBCUTANEOUS

## 2016-12-12 NOTE — Progress Notes (Signed)
NEXPLANON REMOVAL NOTE  Date of LMP:   unknown  Contraception used: *Nexplanon   Indications:  The patient desires removal of Nexplanon and reinsertion of new rod.  She understands risks, benefits, and alternatives to Implanon and would like to proceed.  Anesthesia:   Lidocaine 1% plain.  Procedure:  A time-out was performed confirming the procedure and the patient's allergy status.  Complications: None                      The rod was palpated and the area was sterilely prepped.  The area beneath the distal tip was anesthetized with 1% xylocaine and the skin incised                       Over the tip and the tip was exposed, grasped with forcep and removed intact.    Nexplanon Procedure Note   PRE-OP DIAGNOSIS: desired long-term, reversible contraception  POST-OP DIAGNOSIS: Same  PROCEDURE: Nexplanon  placement Performing Provider: Brock Badharles A Jacory Kamel MD  Patient education prior to procedure, explained risk, benefits of Nexplanon, reviewed alternative options. Patient reported understanding. Gave consent to continue with procedure.   PROCEDURE:  Pregnancy Text :  Negative Site (check):      left arm         Sterile Preparation:   Betadinex3 Lot # [id number] Expiration Date 05 / 2020  Insertion site was selected 8 - 10 cm from medial epicondyle and marked along with guiding site using sterile marker. Procedure area was prepped and draped in a sterile fashion. 1% Lidocaine 1.5 ml given prior to procedure. Nexplanon  was inserted subcutaneously.Needle was removed from the insertion site. Nexplanon capsule was palpated by provider and patient to assure satisfactory placement. Dressing applied.  Followup: The patient tolerated the procedure well without complications.  Standard post-procedure care is explained and return precautions are given.  Brock Badharles A Franziska Podgurski MD

## 2016-12-16 ENCOUNTER — Encounter: Payer: Self-pay | Admitting: Internal Medicine

## 2016-12-16 ENCOUNTER — Ambulatory Visit: Payer: Medicaid Other | Attending: Internal Medicine | Admitting: Internal Medicine

## 2016-12-16 VITALS — BP 117/84 | HR 90 | Temp 98.8°F | Resp 16 | Wt 130.2 lb

## 2016-12-16 DIAGNOSIS — R21 Rash and other nonspecific skin eruption: Secondary | ICD-10-CM | POA: Diagnosis present

## 2016-12-16 DIAGNOSIS — Z0000 Encounter for general adult medical examination without abnormal findings: Secondary | ICD-10-CM | POA: Diagnosis not present

## 2016-12-16 DIAGNOSIS — Z87440 Personal history of urinary (tract) infections: Secondary | ICD-10-CM | POA: Diagnosis not present

## 2016-12-16 DIAGNOSIS — N644 Mastodynia: Secondary | ICD-10-CM

## 2016-12-16 DIAGNOSIS — Z793 Long term (current) use of hormonal contraceptives: Secondary | ICD-10-CM | POA: Insufficient documentation

## 2016-12-16 DIAGNOSIS — R200 Anesthesia of skin: Secondary | ICD-10-CM | POA: Insufficient documentation

## 2016-12-16 DIAGNOSIS — Z131 Encounter for screening for diabetes mellitus: Secondary | ICD-10-CM | POA: Diagnosis not present

## 2016-12-16 DIAGNOSIS — Z7901 Long term (current) use of anticoagulants: Secondary | ICD-10-CM | POA: Insufficient documentation

## 2016-12-16 DIAGNOSIS — L700 Acne vulgaris: Secondary | ICD-10-CM | POA: Insufficient documentation

## 2016-12-16 DIAGNOSIS — R202 Paresthesia of skin: Secondary | ICD-10-CM

## 2016-12-16 MED ORDER — NYSTATIN 100000 UNIT/GM EX POWD: Freq: Four times a day (QID) | CUTANEOUS | 0 refills | Status: DC

## 2016-12-16 MED ORDER — CLINDAMYCIN PHOS-BENZOYL PEROX 1-5 % EX GEL: Freq: Two times a day (BID) | CUTANEOUS | 2 refills | Status: DC

## 2016-12-16 MED FILL — CLINDAMYCIN-BENZOYL PEROX 1: 1-5 | 40 days supply | Qty: 50 | Fill #0

## 2016-12-16 MED FILL — NYSTOP 100,000 UNITS/GM PWD: 100000 | 5 days supply | Qty: 15 | Fill #0
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
## 2016-12-16 NOTE — Patient Instructions (Addendum)
cetaphil - good over the counter face wash.  -   Acne Acne is a skin problem that causes small, red bumps (pimples). Acne happens when the tiny holes in your skin (pores) get blocked. Your pores may become red, sore, and swollen. They may also become infected. Acne is a common skin problem. It is especially common in teenagers. Acne usually goes away over time. Follow these instructions at home: Good skin care is the most important thing you can do to treat your acne. Take care of your skin as told by your doctor. You may be told to do these things:  Wash your skin gently at least two times each day. You should also wash your skin:  After you exercise.  Before you go to bed.  Use mild soap.  Use a water-based skin moisturizer after you wash your skin.  Use a sunscreen or sunblock with SPF 30 or greater. This is very important if you are using acne medicines.  Choose cosmetics that will not plug your oil glands (are noncomedogenic). Medicines   Take over-the-counter and prescription medicines only as told by your doctor.  If you were prescribed an antibiotic medicine, apply or take it as told by your doctor. Do not stop using the antibiotic even if your acne improves. General instructions   Keep your hair clean and off of your face. Shampoo your hair regularly. If you have oily hair, you may need to wash it every day.  Avoid leaning your chin or forehead on your hands.  Avoid wearing tight headbands or hats.  Avoid picking or squeezing your pimples. That can make your acne worse and cause scarring.  Keep all follow-up visits as told by your doctor. This is important.  Shave gently. Only shave when it is necessary.  Keep a food journal. This can help you to see if any foods are linked with your acne. Contact a doctor if:  Your acne is not better after eight weeks.  Your acne gets worse.  You have a large area of skin that is red or tender.  You think that you are  having side effects from any acne medicine. This information is not intended to replace advice given to you by your health care provider. Make sure you discuss any questions you have with your health care provider. Document Released: 08/29/2011 Document Revised: 02/15/2016 Document Reviewed: 11/16/2014 Elsevier Interactive Patient Education  2017 [place] Maintenance, Female Adopting a healthy lifestyle and getting preventive care can go a long way to promote health and wellness. Talk with your health care provider about what schedule of regular examinations is right for you. This is a good chance for you to check in with your provider about disease prevention and staying healthy. In between checkups, there are plenty of things you can do on your own. Experts have done a lot of research about which lifestyle changes and preventive measures are most likely to keep you healthy. Ask your health care provider for more information. Weight and diet Eat a healthy diet  Be sure to include plenty of vegetables, fruits, low-fat dairy products, and lean protein.  Do not eat a lot of foods high in solid fats, added sugars, or salt.  Get regular exercise. This is one of the most important things you can do for your health.  Most adults should exercise for at least 150 minutes each week. The exercise should increase your heart rate and make you sweat (moderate-intensity exercise).  Most adults should also do strengthening exercises at least twice a week. This is in addition to the moderate-intensity exercise. Maintain a healthy weight  Body mass index (BMI) is a measurement that can be used to identify possible weight problems. It estimates body fat based on height and weight. Your health care provider can help determine your BMI and help you achieve or maintain a healthy weight.  For females 13 years of age and older:  A BMI below 18.5 is considered underweight.  A BMI of 18.5 to  24.9 is normal.  A BMI of 25 to 29.9 is considered overweight.  A BMI of 30 and above is considered obese. Watch levels of cholesterol and blood lipids  You should start having your blood tested for lipids and cholesterol at 25 years of age, then have this test every 5 years.  You may need to have your cholesterol levels checked more often if:  Your lipid or cholesterol levels are high.  You are older than 25 years of age.  You are at high risk for heart disease. Cancer screening Lung Cancer  Lung cancer screening is recommended for adults 26-68 years old who are at high risk for lung cancer because of a history of smoking.  A yearly low-dose CT scan of the lungs is recommended for people who:  Currently smoke.  Have quit within the past 15 years.  Have at least a 30-pack-year history of smoking. A pack year is smoking an average of one pack of cigarettes a day for 1 year.  Yearly screening should continue until it has been 15 years since you quit.  Yearly screening should stop if you develop a health problem that would prevent you from having lung cancer treatment. Breast Cancer  Practice breast self-awareness. This means understanding how your breasts normally appear and feel.  It also means doing regular breast self-exams. Let your health care provider know about any changes, no matter how small.  If you are in your 20s or 30s, you should have a clinical breast exam (CBE) by a health care provider every 1-3 years as part of a regular health exam.  If you are 92 or older, have a CBE every year. Also consider having a breast X-ray (mammogram) every year.  If you have a family history of breast cancer, talk to your health care provider about genetic screening.  If you are at high risk for breast cancer, talk to your health care provider about having an MRI and a mammogram every year.  Breast cancer gene (BRCA) assessment is recommended for women who have family members  with BRCA-related cancers. BRCA-related cancers include:  Breast.  Ovarian.  Tubal.  Peritoneal cancers.  Results of the assessment will determine the need for genetic counseling and BRCA1 and BRCA2 testing. Cervical Cancer  Your health care provider may recommend that you be screened regularly for cancer of the pelvic organs (ovaries, uterus, and vagina). This screening involves a pelvic examination, including checking for microscopic changes to the surface of your cervix (Pap test). You may be encouraged to have this screening done every 3 years, beginning at age 30.  For women ages 66-65, health care providers may recommend pelvic exams and Pap testing every 3 years, or they may recommend the Pap and pelvic exam, combined with testing for human papilloma virus (HPV), every 5 years. Some types of HPV increase your risk of cervical cancer. Testing for HPV may also be done on women of any age with  unclear Pap test results.  Other health care providers may not recommend any screening for nonpregnant women who are considered low risk for pelvic cancer and who do not have symptoms. Ask your health care provider if a screening pelvic exam is right for you.  If you have had past treatment for cervical cancer or a condition that could lead to cancer, you need Pap tests and screening for cancer for at least 20 years after your treatment. If Pap tests have been discontinued, your risk factors (such as having a new sexual partner) need to be reassessed to determine if screening should resume. Some women have medical problems that increase the chance of getting cervical cancer. In these cases, your health care provider may recommend more frequent screening and Pap tests. Colorectal Cancer  This type of cancer can be detected and often prevented.  Routine colorectal cancer screening usually begins at 25 years of age and continues through 25 years of age.  Your health care provider may recommend  screening at an earlier age if you have risk factors for colon cancer.  Your health care provider may also recommend using home test kits to check for hidden blood in the stool.  A small camera at the end of a tube can be used to examine your colon directly (sigmoidoscopy or colonoscopy). This is done to check for the earliest forms of colorectal cancer.  Routine screening usually begins at age 31.  Direct examination of the colon should be repeated every 5-10 years through 25 years of age. However, you may need to be screened more often if early forms of precancerous polyps or small growths are found. Skin Cancer  Check your skin from head to toe regularly.  Tell your health care provider about any new moles or changes in moles, especially if there is a change in a mole's shape or color.  Also tell your health care provider if you have a mole that is larger than the size of a pencil eraser.  Always use sunscreen. Apply sunscreen liberally and repeatedly throughout the day.  Protect yourself by wearing long sleeves, pants, a wide-brimmed hat, and sunglasses whenever you are outside. Heart disease, diabetes, and high blood pressure  High blood pressure causes heart disease and increases the risk of stroke. High blood pressure is more likely to develop in:  People who have blood pressure in the high end of the normal range (130-139/85-89 mm Hg).  People who are overweight or obese.  People who are African American.  If you are 65-33 years of age, have your blood pressure checked every 3-5 years. If you are 34 years of age or older, have your blood pressure checked every year. You should have your blood pressure measured twice-once when you are at a hospital or clinic, and once when you are not at a hospital or clinic. Record the average of the two measurements. To check your blood pressure when you are not at a hospital or clinic, you can use:  An automated blood pressure machine at a  pharmacy.  A home blood pressure monitor.  If you are between 23 years and 36 years old, ask your health care provider if you should take aspirin to prevent strokes.  Have regular diabetes screenings. This involves taking a blood sample to check your fasting blood sugar level.  If you are at a normal weight and have a low risk for diabetes, have this test once every three years after 25 years of age.  If you are overweight and have a high risk for diabetes, consider being tested at a younger age or more often. Preventing infection Hepatitis B  If you have a higher risk for hepatitis B, you should be screened for this virus. You are considered at high risk for hepatitis B if:  You were born in a country where hepatitis B is common. Ask your health care provider which countries are considered high risk.  Your parents were born in a high-risk country, and you have not been immunized against hepatitis B (hepatitis B vaccine).  You have HIV or AIDS.  You use needles to inject street drugs.  You live with someone who has hepatitis B.  You have had sex with someone who has hepatitis B.  You get hemodialysis treatment.  You take certain medicines for conditions, including cancer, organ transplantation, and autoimmune conditions. Hepatitis C  Blood testing is recommended for:  Everyone born from [birth year] through 1964.  Anyone with known risk factors for hepatitis C. Sexually transmitted infections (STIs)  You should be screened for sexually transmitted infections (STIs) including gonorrhea and chlamydia if:  You are sexually active and are younger than 25 years of age.  You are older than 25 years of age and your health care provider tells you that you are at risk for this type of infection.  Your sexual activity has changed since you were last screened and you are at an increased risk for chlamydia or gonorrhea. Ask your health care provider if you are at risk.  If you do not have  HIV, but are at risk, it may be recommended that you take a prescription medicine daily to prevent HIV infection. This is called pre-exposure prophylaxis (PrEP). You are considered at risk if:  You are sexually active and do not regularly use condoms or know the HIV status of your partner(s).  You take drugs by injection.  You are sexually active with a partner who has HIV. Talk with your health care provider about whether you are at high risk of being infected with HIV. If you choose to begin PrEP, you should first be tested for HIV. You should then be tested every 3 months for as long as you are taking PrEP. Pregnancy  If you are premenopausal and you may become pregnant, ask your health care provider about preconception counseling.  If you may become pregnant, take 400 to 800 micrograms (mcg) of folic acid every day.  If you want to prevent pregnancy, talk to your health care provider about birth control (contraception). Osteoporosis and menopause  Osteoporosis is a disease in which the bones lose minerals and strength with aging. This can result in serious bone fractures. Your risk for osteoporosis can be identified using a bone density scan.  If you are 75 years of age or older, or if you are at risk for osteoporosis and fractures, ask your health care provider if you should be screened.  Ask your health care provider whether you should take a calcium or vitamin D supplement to lower your risk for osteoporosis.  Menopause may have certain physical symptoms and risks.  Hormone replacement therapy may reduce some of these symptoms and risks. Talk to your health care provider about whether hormone replacement therapy is right for you. Follow these instructions at home:  Schedule regular health, dental, and eye exams.  Stay current with your immunizations.  Do not use any tobacco products including cigarettes, chewing tobacco, or electronic cigarettes.  If you are  pregnant, do not  drink alcohol.  If you are breastfeeding, limit how much and how often you drink alcohol.  Limit alcohol intake to no more than 1 drink per day for nonpregnant women. One drink equals 12 ounces of beer, 5 ounces of wine, or 1 ounces of hard liquor.  Do not use street drugs.  Do not share needles.  Ask your health care provider for help if you need support or information about quitting drugs.  Tell your health care provider if you often feel depressed.  Tell your health care provider if you have ever been abused or do not feel safe at home. This information is not intended to replace advice given to you by your health care provider. Make sure you discuss any questions you have with your health care provider. Document Released: 03/25/2011 Document Revised: 02/15/2016 Document Reviewed: 06/13/2015 Elsevier Interactive Patient Education  2017 [employer].

## 2016-12-17 ENCOUNTER — Other Ambulatory Visit: Payer: Self-pay | Admitting: Internal Medicine

## 2016-12-17 LAB — BASIC METABOLIC PANEL
BUN/Creatinine Ratio: 10 (ref 9–23)
BUN: 7 mg/dL (ref 6–20)
CO2: 25 mmol/L (ref 18–29)
CREATININE: 0.7 mg/dL (ref 0.57–1.00)
Calcium: 9.7 mg/dL (ref 8.7–10.2)
Chloride: 102 mmol/L (ref 96–106)
GFR calc non Af Amer: 122 mL/min/{1.73_m2} (ref >59)
GFR, EST AFRICAN AMERICAN: 140 mL/min/{1.73_m2} (ref >59)
Glucose: 75 mg/dL (ref 65–99)
Potassium: 4.4 mmol/L (ref 3.5–5.2)
Sodium: 140 mmol/L (ref 134–144)

## 2016-12-17 LAB — CBC WITH DIFFERENTIAL/PLATELET
BASOS: 0 %
Basophils Absolute: 0 10*3/uL (ref 0.0–0.2)
EOS (ABSOLUTE): 0 10*3/uL (ref 0.0–0.4)
Eos: 0 %
HEMOGLOBIN: 14 g/dL (ref 11.1–15.9)
Hematocrit: 41.4 % (ref 34.0–46.6)
IMMATURE GRANS (ABS): 0 10*3/uL (ref 0.0–0.1)
Immature Granulocytes: 0 %
LYMPHS ABS: 2.8 10*3/uL (ref 0.7–3.1)
LYMPHS: 36 %
MCH: 30.8 pg (ref 26.6–33.0)
MCHC: 33.8 g/dL (ref 31.5–35.7)
MCV: 91 fL (ref 79–97)
Monocytes Absolute: 0.6 10*3/uL (ref 0.1–0.9)
Monocytes: 7 %
NEUTROS PCT: 57 %
Neutrophils Absolute: 4.4 10*3/uL (ref 1.4–7.0)
Platelets: 249 10*3/uL (ref 150–379)
RBC: 4.54 x10E6/uL (ref 3.77–5.28)
RDW: 13.3 % (ref 12.3–15.4)
WBC: 7.8 10*3/uL (ref 3.4–10.8)

## 2016-12-17 LAB — HEMOGLOBIN A1C
ESTIMATED AVERAGE GLUCOSE: 100 mg/dL
HEMOGLOBIN A1C: 5.1 % (ref 4.8–5.6)

## 2016-12-17 LAB — TSH: TSH: 2.8 u[IU]/mL (ref 0.450–4.500)

## 2016-12-17 LAB — VITAMIN D 25 HYDROXY (VIT D DEFICIENCY, FRACTURES): Vit D, 25-Hydroxy: 11.3 ng/mL — ABNORMAL LOW (ref 30.0–100.0)

## 2016-12-17 MED ORDER — VITAMIN D (ERGOCALCIFEROL) 1.25 MG (50000 UNIT) PO CAPS: 50000.0000 [IU] | ORAL_CAPSULE | ORAL | 0 refills | Status: DC

## 2016-12-17 MED FILL — VIT D2 1.25 MG (50,000 UNIT: 1.25 MG | 84 days supply | Qty: 12 | Fill #0

## 2016-12-19 ENCOUNTER — Telehealth: Payer: Self-pay

## 2016-12-19 ENCOUNTER — Telehealth: Payer: Self-pay | Admitting: Internal Medicine

## 2016-12-19 NOTE — Telephone Encounter (Signed)
Contacted pt to go over lab results pt didn't answer lvm asking pt to give me a call at her earliest convenience  

## 2016-12-19 NOTE — Telephone Encounter (Signed)
Pt called requesting results. Per Gabriela Benton'a results were given. Pt understood.

## 2016-12-26 ENCOUNTER — Ambulatory Visit: Payer: Medicaid Other | Admitting: Obstetrics

## 2017-01-08 ENCOUNTER — Emergency Department (HOSPITAL_COMMUNITY)
Admission: EM | Admit: 2017-01-08 | Discharge: 2017-01-08 | Disposition: A | Discharge status: Home / Self Care | Payer: Medicaid Other | Attending: Emergency Medicine | Admitting: Emergency Medicine

## 2017-01-08 ENCOUNTER — Encounter (HOSPITAL_COMMUNITY): Payer: Self-pay | Admitting: *Deleted

## 2017-01-08 ENCOUNTER — Other Ambulatory Visit: Payer: Self-pay | Admitting: Internal Medicine

## 2017-01-08 DIAGNOSIS — N644 Mastodynia: Secondary | ICD-10-CM

## 2017-01-08 DIAGNOSIS — Z7901 Long term (current) use of anticoagulants: Secondary | ICD-10-CM | POA: Diagnosis not present

## 2017-01-08 LAB — PREGNANCY, URINE: Preg Test, Ur: NEGATIVE

## 2017-01-08 MED ORDER — IBUPROFEN 800 MG PO TABS
800.0000 mg | ORAL_TABLET | Freq: Once | ORAL | Status: AC
  Administered 2017-01-08: 800 mg via ORAL
  Filled 2017-01-08: qty 1

## 2017-01-08 MED ORDER — IBUPROFEN 800 MG PO TABS: 800.0000 mg | ORAL_TABLET | Freq: Three times a day (TID) | ORAL | 0 refills | Status: DC

## 2017-01-08 MED ORDER — IBUPROFEN 800 MG PO TABS: 800.0000 mg | ORAL_TABLET | Freq: Once | ORAL | Status: DC
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
## 2017-01-08 NOTE — ED Triage Notes (Signed)
Pt states R breast pain since October.  States was seen by pcp x 2 who didn't think anything was wrong, but scheduled her for an appointment at [facility].  Pt went there today, but they stated that her appointment was next week.  She feels she cannot wait that long because the pain is getting worse. Denies swelling, drainage.

## 2017-01-08 NOTE — ED Notes (Signed)
Pt denies drainage or change in breast tissue. Pt states she feels her right breast is heavy and has sharp shooting pains.

## 2017-01-15 ENCOUNTER — Ambulatory Visit
Admission: RE | Admit: 2017-01-15 | Discharge: 2017-01-15 | Disposition: A | Discharge status: Home / Self Care | Payer: Medicaid Other | Source: Ambulatory Visit | Attending: Internal Medicine | Admitting: Internal Medicine

## 2017-01-15 DIAGNOSIS — N644 Mastodynia: Secondary | ICD-10-CM

## 2017-02-06 ENCOUNTER — Encounter: Payer: Self-pay | Admitting: Internal Medicine

## 2017-02-07 ENCOUNTER — Encounter: Payer: Self-pay | Admitting: Internal Medicine

## 2017-02-10 ENCOUNTER — Encounter: Payer: Self-pay | Admitting: Internal Medicine

## 2017-03-28 ENCOUNTER — Encounter: Payer: Self-pay | Admitting: Family Medicine

## 2017-03-28 ENCOUNTER — Ambulatory Visit: Payer: Medicaid Other | Admitting: Licensed Clinical Social Worker

## 2017-03-28 ENCOUNTER — Ambulatory Visit: Payer: Medicaid Other | Attending: Family Medicine | Admitting: Family Medicine

## 2017-03-28 VITALS — BP 107/75 | HR 82 | Temp 98.4°F | Resp 18 | Ht 59.0 in | Wt 130.0 lb

## 2017-03-28 DIAGNOSIS — G8929 Other chronic pain: Secondary | ICD-10-CM | POA: Insufficient documentation

## 2017-03-28 DIAGNOSIS — L700 Acne vulgaris: Secondary | ICD-10-CM | POA: Diagnosis not present

## 2017-03-28 DIAGNOSIS — K432 Incisional hernia without obstruction or gangrene: Secondary | ICD-10-CM | POA: Diagnosis not present

## 2017-03-28 DIAGNOSIS — F329 Major depressive disorder, single episode, unspecified: Secondary | ICD-10-CM | POA: Insufficient documentation

## 2017-03-28 DIAGNOSIS — R4589 Other symptoms and signs involving emotional state: Secondary | ICD-10-CM

## 2017-03-28 DIAGNOSIS — M5441 Lumbago with sciatica, right side: Secondary | ICD-10-CM

## 2017-03-28 DIAGNOSIS — Z7902 Long term (current) use of antithrombotics/antiplatelets: Secondary | ICD-10-CM | POA: Diagnosis not present

## 2017-03-28 DIAGNOSIS — R200 Anesthesia of skin: Secondary | ICD-10-CM | POA: Insufficient documentation

## 2017-03-28 DIAGNOSIS — F419 Anxiety disorder, unspecified: Principal | ICD-10-CM

## 2017-03-28 MED ORDER — METHOCARBAMOL 500 MG PO TABS: 500.0000 mg | ORAL_TABLET | Freq: Three times a day (TID) | ORAL | 0 refills | Status: DC | PRN

## 2017-03-28 MED ORDER — CETAPHIL GENTLE CLEANSER EX LIQD: 1.0000 "application " | Freq: Two times a day (BID) | CUTANEOUS | Status: DC

## 2017-03-28 MED ORDER — TRETINOIN 0.025 % EX CREA: TOPICAL_CREAM | Freq: Every day | CUTANEOUS | 0 refills | Status: DC

## 2017-03-28 MED ORDER — IBUPROFEN 600 MG PO TABS: 600.0000 mg | ORAL_TABLET | Freq: Three times a day (TID) | ORAL | 1 refills | Status: DC | PRN

## 2017-03-28 MED FILL — METHOCARBAMOL 500 MG TABLET: 500 | 10 days supply | Qty: 30 | Fill #0

## 2017-03-28 MED FILL — TRETINOIN 0.025% CREAM: 0.025 | 30 days supply | Qty: 45 | Fill #0

## 2017-03-28 MED FILL — IBUPROFEN 600 MG TABLET: 600 | 10 days supply | Qty: 30 | Fill #0

## 2017-03-28 NOTE — Progress Notes (Signed)
Patient is here for facial rash  Patient complains about pain in her c section cut since 7 years ago it comes and goes mostly when laying down

## 2017-03-28 NOTE — BH Specialist Note (Signed)
Integrated Behavioral Health Initial Visit  MRN: [id number] Name: Gabriela Benton   Session Start time: 9:10 AM Session End time: 9:40 AM Total time: 30 minutes  Type of Service: Integrated Behavioral Health- Individual/Family Interpretor:No. Interpretor Name and Language: N/A   Warm Hand Off Completed.       SUBJECTIVE: Gabriela Benton is a 26 y.o. female accompanied by patient. Patient was referred by FNP [place] for depression. Patient reports the following symptoms/concerns: sleeping for prolonged periods of time, low energy, decreased appetite, and withdrawn Duration of problem: 1-2 years; Severity of problem: mild  OBJECTIVE: Mood: Depressed and Affect: Appropriate Risk of harm to self or others: No plan to harm self or others   LIFE CONTEXT: Family and Social: Pt resides with boyfriend and four minor kids (between ages 163 and 657) Pt receives additional emotional support from mother and boyfriend's cousin who resides nearby. She has two brothers and a younger sister residing in [place] School/Work: Pt is currently unemployed. She has completed certifications for Phlebotomy and CMA; however, has yet to pass exam Self-Care: Pt takes vitamins to increase appetite and sleeps to cope with stress. No report of substance use Life Changes: Pt has had difficulty passing exams to complete certification.   GOALS ADDRESSED: Patient will reduce symptoms of: depression and increase knowledge and/or ability of: coping skills and also: Increase adequate support systems for patient/family   INTERVENTIONS: Solution-Focused Strategies, Supportive Counseling, Psychoeducation and/or Health Education and Link to [employer] Resources  Standardized Assessments completed: PHQ 2&9  ASSESSMENT: Patient currently experiencing depression triggered by financial strain. She reports sleeping for prolonged periods of time, low energy, decreased appetite, and withdrawn behavior. Patient receives support from  boyfriend and mother. Patient may benefit from psychoeducation, psychotherapy, and community resources to assist with psychosocial stressors. LCSWA educated pt on the correlation between physical and mental health, in addition, to how stress can negatively impact one's health. Pt successfully identified healthy coping skills to utilize on a routine basis. LCSWA provided pt with resources on employment, food insecurity, and crisis intervention. She is not interested in psychotherapy or medication management at this time.  PLAN: 1. Follow up with behavioral health clinician on : Pt was encouraged to contact LCSWA if symptoms worsen or fail to improve to [facility] 2. Behavioral recommendations: LCSWA recommends that pt apply healthy coping skills discussed and utilize provided resources. Pt is encouraged to schedule follow up appointment with LCSWA 3. Referral(s): Integrated [facility]al [facility] Services (In Clinic) and [employer] Resources:  Academic librarianood and Finances 4. "From scale of 1-10, how likely are you to follow plan?": 8/10  Bridgett LarssonJasmine D Pasquale Matters, LCSW 03/28/17 3:07 PM

## 2017-03-28 NOTE — Patient Instructions (Signed)
Acne Acne is a skin problem that causes small, red bumps (pimples). Acne happens when the tiny holes in your skin (pores) get blocked. Your pores may become red, sore, and swollen. They may also become infected. Acne is a common skin problem. It is especially common in teenagers. Acne usually goes away over time. Follow these instructions at home: Good skin care is the most important thing you can do to treat your acne. Take care of your skin as told by your doctor. You may be told to do these things:  Wash your skin gently at least two times each day. You should also wash your skin: ? After you exercise. ? Before you go to bed.  Use mild soap.  Use a water-based skin moisturizer after you wash your skin.  Use a sunscreen or sunblock with SPF 30 or greater. This is very important if you are using acne medicines.  Choose cosmetics that will not plug your oil glands (are noncomedogenic).  Medicines  Take over-the-counter and prescription medicines only as told by your doctor.  If you were prescribed an antibiotic medicine, apply or take it as told by your doctor. Do not stop using the antibiotic even if your acne improves. General instructions  Keep your hair clean and off of your face. Shampoo your hair regularly. If you have oily hair, you may need to wash it every day.  Avoid leaning your chin or forehead on your hands.  Avoid wearing tight headbands or hats.  Avoid picking or squeezing your pimples. That can make your acne worse and cause scarring.  Keep all follow-up visits as told by your doctor. This is important.  Shave gently. Only shave when it is necessary.  Keep a food journal. This can help you to see if any foods are linked with your acne. Contact a doctor if:  Your acne is not better after eight weeks.  Your acne gets worse.  You have a large area of skin that is red or tender.  You think that you are having side effects from any acne medicine. This  information is not intended to replace advice given to you by your health care provider. Make sure you discuss any questions you have with your health care provider. Document Released: 08/29/2011 Document Revised: 02/15/2016 Document Reviewed: 11/16/2014 Elsevier Interactive Patient Education  2018 Elsevier Inc.  Tretinoin skin cream (Acne) What is this medicine? TRETINOIN (TRET i noe in) is a naturally occurring form of vitamin A. It is used on the skin to treat mild to moderate acne. This medicine may be used for other purposes; ask your health care provider or pharmacist if you have questions. COMMON BRAND NAME(S): [place]in[place], AVITA, Refissa, Retin-A, Tretin-X What should I tell my health care provider before I take this medicine? They need to know if you have any of these conditions: -eczema -excessive sensitivity to the sun -sunburn -an unusual or allergic reaction to tretinoin, vitamin A, other medicines, foods, dyes, or preservatives -pregnant or trying to get pregnant -breast-feeding How should I use this medicine? This medicine is for external use only. Do not take by mouth. Follow the directions on the prescription label. Gently wash your face with a mild, non-medicated soap before use. Pat the skin dry. Wait 20 to 30 minutes for your skin to dry before use in order to minimize the possibility of skin irritation. Apply enough medicine to cover the affected area and rub in gently. Avoid applying this medicine to your eyes, ears, nostrils,  angles of the nose, and mouth. Do not use more often than your doctor or health care professional has recommended. Using too much of this medicine may irritate or increase the irritation of your skin, and will not give faster or better results. Talk to your pediatrician regarding the use of this medicine in children. While this drug may be prescribed for children as young as 25 years of age for selected conditions, precautions do apply. Overdosage: If you  think you have taken too much of this medicine contact a poison control center or emergency room at once. NOTE: This medicine is only for you. Do not share this medicine with others. What if I miss a dose? If you miss a dose, skip that dose and continue with your regular schedule. Do not use extra doses, or use for a longer period of time than directed by your doctor or health care professional. What may interact with this medicine? -medicines or other preparations that may dry your skin such as benzoyl peroxide or salicylic acid -medicines that increase your sensitivity to sunlight such as tetracycline or sulfa drugs This list may not describe all possible interactions. Give your health care provider a list of all the medicines, herbs, non-prescription drugs, or dietary supplements you use. Also tell them if you smoke, drink alcohol, or use illegal drugs. Some items may interact with your medicine. What should I watch for while using this medicine? Your acne may get worse initially and should then start to improve. It may take 2 to 12 weeks before you see the full effect. Do not wash your face more than 2 or 3 times a day, unless directed by your doctor or health care professional. Do not use the following products on the same areas that you are treating with this medicine, unless otherwise directed by your doctor or health care professional: other topical agents with a strong skin drying effect such as products with a high alcohol content, astringents, spices, the peel of lime or other citrus, medicated soaps or shampoos, permanent wave solutions, electrolysis, hair removers or waxes, or any other preparations or processes that might dry or irritate your skin. This medicine can make you more sensitive to the sun. Keep out of the sun. If you cannot avoid being in the sun, wear protective clothing and use sunscreen. Do not use sun lamps or tanning beds/booths. Avoid cold weather and wind as much as  possible, and use clothing to protect you from the weather. Skin treated with this medicine may dry out or get wind burned more easily. What side effects may I notice from receiving this medicine? Side effects that you should report to your doctor or health care professional as soon as possible: -darkening or lightening of the treated areas -severe burning, itching, crusting, or swelling of the treated areas Side effects that usually do not require medical attention (report to your doctor or health care professional if they continue or are bothersome): -increased sensitivity to the sun -itching -mild stinging -red, inflamed, and irritated skin, the skin may peel after a few days This list may not describe all possible side effects. Call your doctor for medical advice about side effects. You may report side effects to FDA at 1-800-FDA-1088. Where should I keep my medicine? Keep out of the reach of children. Store below 27 degrees C (80 degrees F). Do not freeze. Protect from light. Throw away any unused medicine after the expiration date. NOTE: This sheet is a summary. It may not  cover all possible information. If you have questions about this medicine, talk to your doctor, pharmacist, or health care provider.  2018 Elsevier/Gold Standard (2008-05-25 17:38:22)

## 2017-03-28 NOTE — Progress Notes (Signed)
Subjective:  Patient ID: Gabriela Benton, female    DOB: [birth year]  Age: 26 y.o. MRN: [id number]  CC: Establish Care   HPI Gabriela Benton presents for complains of chronic low back pain. The patient first reports noticing symptoms 2years ago after epidural. The pain is rated severe, and is located at mid-lower back and radiates to the  right leg. The pain is described as aching and numbness, and is  constant.. The symptoms have not been progressive. Symptoms are exacerbated by sitting. Factors which relieve the pain include rest and recumbancy . Other associated symptoms include numbness and feeling of heaviness that extends down the right extremity. Treatment efforts have included rest, OTC NSAIDS and prescription NSAIDS, with minimal relief. She complaints of right sided pelvic pain at c-section incision site. Reports sensation of "pulling" that is aggravated by lifting and sexual intercourse. History of two c-sections. Patient  also complaints of acne rash. Onset was April 2018. Symptoms have gradually worsened. Lesions are described as closed comedones and superficial pustules. Acne is primarily located on the face. Treatment to date has included none recently.  Elevated PHQ9 screen. Patient denies SI/HI. She is agreeable to speaking with LCSW at this time.   Outpatient Medications Prior to Visit  Medication Sig Dispense Refill  . clindamycin-benzoyl peroxide (BENZACLIN) gel Apply topically 2 (two) times daily. acne (Patient not taking: Reported on 03/28/2017) 25 g 2  . etonogestrel (NEXPLANON) 68 MG IMPL implant 1 each by Subdermal route once.    . metroNIDAZOLE (FLAGYL) 500 MG tablet Take 1 tablet (500 mg total) by mouth 2 (two) times daily. (Patient not taking: Reported on 11/20/2016) 14 tablet 0  . nystatin (MYCOSTATIN/NYSTOP) powder Apply topically 4 (four) times daily. (Patient not taking: Reported on 03/28/2017) 15 g 0  . rivaroxaban (XARELTO) 20 MG TABS tablet Take 1 tablet (20 mg total) by mouth  daily with supper. (Patient not taking: Reported on 03/19/2016) 30 tablet 2  . Vitamin D, Ergocalciferol, (DRISDOL) 50000 units CAPS capsule Take 1 capsule (50,000 Units total) by mouth every 7 (seven) days. (Patient not taking: Reported on 03/28/2017) 12 capsule 0  . XARELTO STARTER PACK 15 & 20 MG TBPK Take 15-20 mg by mouth as directed. Take as directed on package: Start with one 15mg  tablet by mouth twice a day with food. On Day 22, switch to one 20mg  tablet once a day with food. (Patient not taking: Reported on 02/13/2016) 51 each 0  . ibuprofen (ADVIL,MOTRIN) 600 MG tablet Take 1 tablet (600 mg total) by mouth every 8 (eight) hours as needed. (Patient not taking: Reported on 11/20/2016) 60 tablet 0  . ibuprofen (ADVIL,MOTRIN) 800 MG tablet Take 1 tablet (800 mg total) by mouth 3 (three) times daily. (Patient not taking: Reported on 03/28/2017) 21 tablet 0   No facility-administered medications prior to visit.     ROS Review of Systems  Constitutional: Negative.   Respiratory: Negative.   Cardiovascular: Negative.   Gastrointestinal: Positive for abdominal pain (pulling).  Musculoskeletal: Positive for back pain.  Skin: Positive for rash.  Neurological: Positive for numbness (right leg).  Psychiatric/Behavioral: Positive for dysphoric mood.    Objective:  BP 107/75 (BP Location: Left Arm, Patient Position: Sitting, Cuff Size: Normal)   Pulse 82   Temp 98.4 F (36.9 C) (Oral)   Resp 18   Ht 4\' 11"  (1.499 m)   Wt 130 lb (59 kg)   SpO2 100%   BMI 26.26 kg/m   BP/Weight 03/28/2017 01/08/2017 12/16/2016  Systolic BP 107 108 117  Diastolic BP 75 72 84  Wt. (Lbs) 130 132 130.2  BMI 26.26 26.66 25.43   Physical Exam  Constitutional: She appears well-developed and well-nourished.  Neck: No JVD present.  Cardiovascular: Normal rate, regular rhythm, normal heart sounds and intact distal pulses.   Pulmonary/Chest: Effort normal and breath sounds normal.  Abdominal: Soft. Bowel sounds are  normal.  Musculoskeletal:       Lumbar back: She exhibits pain (positive straight leg test; numbness with extension).  Skin: Skin is warm and dry. Rash (comodones and small scattered papules to the forehead, cheeks, and nose area.) noted.     Nursing note and vitals reviewed.  Assessment & Plan:   Problem List Items Addressed This Visit    None    Visit Diagnoses    Acne vulgaris    -  Primary   Relevant Medications   Soap & Cleansers (CETAPHIL GENTLE CLEANSER) LIQD   tretinoin (RETIN-A) 0.025 % cream   Chronic bilateral low back pain with right-sided sciatica       Relevant Medications   ibuprofen (ADVIL,MOTRIN) 600 MG tablet   methocarbamol (ROBAXIN) 500 MG tablet   Other Relevant Orders   DG Lumbar Spine Complete   Incisional hernia, without obstruction or gangrene       Relevant Orders   Ambulatory referral to General Surgery   Depressed mood            Pt. spoke with LCSW and provided with resources.       Meds ordered this encounter  Medications  . Soap & Cleansers (CETAPHIL GENTLE CLEANSER) LIQD    Sig: Apply 1 application topically 2 (two) times daily. Apply to face wash then rinse with water.    Order Specific Question:   Supervising Provider    Answer:   Quentin Angst [id number]  . tretinoin (RETIN-A) 0.025 % cream    Sig: Apply topically at bedtime.    Dispense:  45 g    Refill:  0    Order Specific Question:   Supervising Provider    Answer:   Quentin Angst [id number]  . ibuprofen (ADVIL,MOTRIN) 600 MG tablet    Sig: Take 1 tablet (600 mg total) by mouth every 8 (eight) hours as needed.    Dispense:  30 tablet    Refill:  1    Order Specific Question:   Supervising Provider    Answer:   Quentin Angst [id number]  . methocarbamol (ROBAXIN) 500 MG tablet    Sig: Take 1 tablet (500 mg total) by mouth every 8 (eight) hours as needed for muscle spasms.    Dispense:  30 tablet    Refill:  0    Order Specific Question:   Supervising Provider     Answer:   Quentin Angst [id number]    Follow-up: Return in about 2 months (around 05/29/2017), or if symptoms worsen or fail to improve, for Acne/ Back pain.   Lizbeth Bark FNP

## 2017-05-15 ENCOUNTER — Ambulatory Visit: Payer: Medicaid Other | Attending: Family Medicine | Admitting: Family Medicine

## 2017-05-15 ENCOUNTER — Encounter: Payer: Self-pay | Admitting: Family Medicine

## 2017-05-15 VITALS — BP 100/68 | HR 77 | Temp 98.7°F | Resp 18 | Ht 59.0 in | Wt 130.6 lb

## 2017-05-15 DIAGNOSIS — Z7902 Long term (current) use of antithrombotics/antiplatelets: Secondary | ICD-10-CM | POA: Insufficient documentation

## 2017-05-15 DIAGNOSIS — L659 Nonscarring hair loss, unspecified: Secondary | ICD-10-CM

## 2017-05-15 DIAGNOSIS — N3000 Acute cystitis without hematuria: Secondary | ICD-10-CM | POA: Diagnosis not present

## 2017-05-15 DIAGNOSIS — E639 Nutritional deficiency, unspecified: Secondary | ICD-10-CM

## 2017-05-15 DIAGNOSIS — R399 Unspecified symptoms and signs involving the genitourinary system: Secondary | ICD-10-CM

## 2017-05-15 LAB — POCT URINALYSIS DIPSTICK
Bilirubin, UA: NEGATIVE
Glucose, UA: NEGATIVE
Ketones, UA: NEGATIVE
Nitrite, UA: NEGATIVE
PH UA: 7 (ref 5.0–8.0)
PROTEIN UA: NEGATIVE
RBC UA: NEGATIVE
SPEC GRAV UA: 1.02 (ref 1.010–1.025)
UROBILINOGEN UA: 1 U/dL

## 2017-05-15 MED ORDER — ENSURE COMPLETE PO LIQD: 237.0000 mL | Freq: Two times a day (BID) | ORAL | 5 refills | Status: DC | PRN

## 2017-05-15 MED ORDER — ONE-A-DAY WOMENS FORMULA PO TABS: 1.0000 | ORAL_TABLET | Freq: Every day | ORAL | 0 refills | Status: DC

## 2017-05-15 MED ORDER — CIPROFLOXACIN HCL 250 MG PO TABS: 250.0000 mg | ORAL_TABLET | Freq: Two times a day (BID) | ORAL | 0 refills | Status: DC

## 2017-05-15 NOTE — Progress Notes (Signed)
Patient is here for hair falling out

## 2017-05-15 NOTE — Patient Instructions (Addendum)
Eating Healthy on a Budget There are many ways to save money at the grocery store and continue to eat healthy. You can be successful if you plan your meals according to your budget, purchase according to your budget and grocery list, and prepare food yourself. How can I buy more food on a limited budget? Plan  Plan meals and snacks according to a grocery list and budget you create.  Look for recipes where you can cook once and make enough food for two meals.  Include meals that will "stretch" more expensive foods such as stews, casseroles, and stir-fry dishes.  Make a grocery list and make sure to bring it with you to the store. If you have a smart phone, you could use your phone to create your shopping list. Purchase  When grocery shopping, buy only the items on your grocery list and go only to the areas of the store that have the items on your list. Prepare  Some meal items can be prepared in advance. Pre-cook on days when you have extra time.  Make extra food (such as by doubling recipes) and freeze the extras in meal-sized containers or in individual portions for fast meals and snacks.  Use leftovers in your meal plan for the week.  Try some meatless meals or try "no cook" meals like salads.  When you come home from the grocery store, wash and prepare your fruits and vegetables so they are ready to use and eat. This will help reduce food waste. How can I buy more food on a limited budget? Try these tips the next time you go shopping:  Buy store brands or generic brands.  Use coupons only for foods and brands you normally buy. Avoid buying items you wouldn't normally buy simply because they are on sale.  Check online and in newspapers for weekly deals.  Buy healthy items from the bulk bins when available, such as herbs, spices, flours, pastas, nuts, and dried fruit.  Buy fruits and vegetables that are in season. Prices are usually lower on in-season produce.  Compare and  contrast different items. You can do this by looking at the unit price on the price tag. Use it to compare different brands and sizes to find out which item is the best deal.  Choose naturally low-cost healthy items, such as carrots, potatoes, apples, bananas, and oranges. Dried or canned beans are a low-cost protein source.  Buy in bulk and freeze extra food. Items you can buy in bulk include meats, fish, poultry, frozen fruits, and frozen vegetables.  Limit the purchase of prepared or "ready-to-eat" foods, such as pre-cut fruits and vegetables and pre-made salads.  If possible, shop around to discover which grocery store offers the best prices. Some stores charge much more than other stores for the same items.  Do not shop when you are hungry. If you shop while hungry, It may be hard to stick to your list and budget.  Stick to your list and resist impulse buys. Treat your list as your official plan for the week.  Buy a variety of vegetables and fruit by purchasing fresh, frozen, and canned items.  Look beyond eye level. Foods at eye level (adult or child eye level) are more expensive. Look at the top and bottom shelves for deals.  Be efficient with your time when shopping. The more time you spend at the store, the more money you are likely to spend.  Consider other retailers such as dollar stores, larger wholesale   stores, local fruit and vegetable stands, and farmers markets.  What are some tips for less expensive food substitutions? When choosing more expensive foods like meats and dairy, try these tips to save money:  Choose cheaper cuts of meat, such as bone-in chicken thighs and drumsticks instead skinless and boneless chicken. When you are ready to prepare the chicken, you can remove the skin yourself to make it healthier.  Choose lean meats like chicken or [place]. When choosing ground beef, make sure it is lean ground beef (92% lean, 8% fat). If you do buy a fattier ground beef,  drain the fat before eating.  Buy dried beans and peas, such as lentils, split peas, or kidney beans.  For seafood, choose canned tuna, salmon, or sardines.  Eggs are a low-cost source of protein.  Buy the larger tubs of yogurt instead of individual-sized containers.  Choose water instead of sodas and other sweetened beverages.  Skip buying chips, cookies, and other "junk food". These items are usually expensive, high in calories, and low in nutritional value.  How can I prepare the foods I buy in the healthiest way? Practice these tips for cooking foods in the healthiest way to reduce excess fat and calorie intake:  Steam, saute, grill, or bake foods instead of frying them.  Make sure half your plate is filled with fruits or vegetables. Choose from fresh, frozen, or canned fruits and vegetables. If eating canned, remember to rinse them before eating. This will remove any excess salt added for packaging.  Trim all fat from meat before cooking. Remove the skin from chicken or [place].  Spoon off fat from meat dishes once they have been chilled in the refrigerator and the fat has hardened on the top.  Use skim milk, low-fat milk, or evaporated skim milk when making cream sauces, soups, or puddings.  Substitute low-fat yogurt, sour cream, or cottage cheese for sour cream and mayonnaise in dips and dressings.  Try lemon juice, herbs, or spices to season food instead of salt, butter, or margarine.  This information is not intended to replace advice given to you by your health care provider. Make sure you discuss any questions you have with your health care provider. Document Released: 05/13/2014 Document Revised: 03/29/2016 Document Reviewed: 04/12/2014 Elsevier Interactive Patient Education  2018 [employer].   Urinary Tract Infection, Adult A urinary tract infection (UTI) is an infection of any part of the urinary tract. The urinary tract includes  the:  Kidneys.  Ureters.  Bladder.  Urethra.  These organs make, store, and get rid of pee (urine) in the body. Follow these instructions at home:  Take over-the-counter and prescription medicines only as told by your doctor.  If you were prescribed an antibiotic medicine, take it as told by your doctor. Do not stop taking the antibiotic even if you start to feel better.  Avoid the following drinks: ? Alcohol. ? Caffeine. ? Tea. ? Carbonated drinks.  Drink enough fluid to keep your pee clear or pale yellow.  Keep all follow-up visits as told by your doctor. This is important.  Make sure to: ? Empty your bladder often and completely. Do not to hold pee for long periods of time. ? Empty your bladder before and after sex. ? Wipe from front to back after a bowel movement if you are female. Use each tissue one time when you wipe. Contact a doctor if:  You have back pain.  You have a fever.  You feel sick to  your stomach (nauseous).  You throw up (vomit).  Your symptoms do not get better after 3 days.  Your symptoms go away and then come back. Get help right away if:  You have very bad back pain.  You have very bad lower belly (abdominal) pain.  You are throwing up and cannot keep down any medicines or water. This information is not intended to replace advice given to you by your health care provider. Make sure you discuss any questions you have with your health care provider. Document Released: 02/26/2008 Document Revised: 02/15/2016 Document Reviewed: 07/31/2015 Elsevier Interactive Patient Education  [employer].

## 2017-05-15 NOTE — Progress Notes (Signed)
Subjective:  Patient ID: Gabriela Benton, female    DOB: 1991/03/14  Age: 26 y.o. MRN: [id number]  CC: Hair/Scalp Problem   HPI [name] presents for complaints of hair loss. Onset 2 years ago. Associated symptoms: none. She denies any family history of bleeding disorders. 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 She does not eat balanced diet. Reports 1 to 2 meals per day. She denies wearing any hairstyles that require traction of the hair. Denies any changes to bowel habits. She does reports increased urinary frequency, malodorous urine which began several weeks ago.    Outpatient Medications Prior to Visit  Medication Sig Dispense Refill  . ibuprofen (ADVIL,MOTRIN) 600 MG tablet Take 1 tablet (600 mg total) by mouth every 8 (eight) hours as needed. 30 tablet 1  . methocarbamol (ROBAXIN) 500 MG tablet Take 1 tablet (500 mg total) by mouth every 8 (eight) hours as needed for muscle spasms. 30 tablet 0  . tretinoin (RETIN-A) 0.025 % cream Apply topically at bedtime. 45 g 0  . clindamycin-benzoyl peroxide (BENZACLIN) gel Apply topically 2 (two) times daily. acne (Patient not taking: Reported on 03/28/2017) 25 g 2  . etonogestrel (NEXPLANON) 68 MG IMPL implant 1 each by Subdermal route once.    . metroNIDAZOLE (FLAGYL) 500 MG tablet Take 1 tablet (500 mg total) by mouth 2 (two) times daily. (Patient not taking: Reported on 11/20/2016) 14 tablet 0  . nystatin (MYCOSTATIN/NYSTOP) powder Apply topically 4 (four) times daily. (Patient not taking: Reported on 03/28/2017) 15 g 0  . rivaroxaban (XARELTO) 20 MG TABS tablet Take 1 tablet (20 mg total) by mouth daily with supper. (Patient not taking: Reported on 03/19/2016) 30 tablet 2  . Soap & Cleansers (CETAPHIL GENTLE CLEANSER) LIQD Apply 1 application topically 2 (two) times daily. Apply to face wash then rinse with water.    . Vitamin D, Ergocalciferol, (DRISDOL) 50000 units CAPS capsule Take 1 capsule (50,000 Units total) by mouth every 7 (seven)  days. (Patient not taking: Reported on 03/28/2017) 12 capsule 0  . XARELTO STARTER PACK 15 & 20 MG TBPK Take 15-20 mg by mouth as directed. Take as directed on package: Start with one 15mg  tablet by mouth twice a day with food. On Day 22, switch to one 20mg  tablet once a day with food. (Patient not taking: Reported on 02/13/2016) 51 each 0   No facility-administered medications prior to visit.     ROS Review of Systems  Constitutional: Negative.   Respiratory: Negative.   Cardiovascular: Negative.   Gastrointestinal: Negative.   Genitourinary: Positive for frequency.  Skin:       Hair loss   Hematological: Negative.   Psychiatric/Behavioral: Negative.    Objective:  BP 100/68 (BP Location: Left Arm, Patient Position: Sitting, Cuff Size: Normal)   Pulse 77   Temp 98.7 F (37.1 C) (Oral)   Resp 18   Ht 4\' 11"  (1.499 m)   Wt 130 lb 9.6 oz (59.2 kg)   SpO2 100%   BMI 26.38 kg/m   BP/Weight 05/15/2017 03/28/2017 01/08/2017  Systolic BP 100 107 108  Diastolic BP 68 75 72  Wt. (Lbs) 130.6 130 132  BMI 26.38 26.26 26.66   Physical Exam  Constitutional: She appears well-developed and well-nourished.  Eyes: Pupils are equal, round, and reactive to light. Conjunctivae are normal.  Neck: Normal range of motion. Neck supple.  Cardiovascular: Normal rate, regular rhythm, normal heart sounds and intact distal pulses.   Pulmonary/Chest: Effort normal and  breath sounds normal.  Abdominal: Soft. Bowel sounds are normal.  Skin: Skin is warm and dry.  Thinning hair along the temporal hair line  Psychiatric: She has a normal mood and affect.  Nursing note and vitals reviewed.    Assessment & Plan:   Problem List Items Addressed This Visit    None    Visit Diagnoses    Non-scarring hair loss    -  Primary   Evaluate causes of hair loss.   Relevant Orders   CBC with Differential   Vitamin D, 25-hydroxy   TSH   Poor nutrition       Encouraged 5 to 6 small meals/snacks per day.    Relevant Medications   Multiple Vitamins-Calcium (ONE-A-DAY WOMENS FORMULA) TABS   feeding supplement, ENSURE COMPLETE, (ENSURE COMPLETE) LIQD   UTI symptoms       Relevant Medications   ciprofloxacin (CIPRO) 250 MG tablet   Other Relevant Orders   Urinalysis Dipstick (Completed)   Acute cystitis without hematuria       Relevant Medications   ciprofloxacin (CIPRO) 250 MG tablet      Meds ordered this encounter  Medications  . Multiple Vitamins-Calcium (ONE-A-DAY WOMENS FORMULA) TABS    Sig: Take 1 tablet by mouth daily.    Refill:  0    Order Specific Question:   Supervising Provider    Answer:   Quentin Angst [id number]  . feeding supplement, ENSURE COMPLETE, (ENSURE COMPLETE) LIQD    Sig: Take 237 mLs by mouth 2 (two) times daily between meals as needed.    Dispense:  6 Bottle    Refill:  5    Order Specific Question:   Supervising Provider    Answer:   Quentin Angst [id number]  . ciprofloxacin (CIPRO) 250 MG tablet    Sig: Take 1 tablet (250 mg total) by mouth 2 (two) times daily.    Dispense:  6 tablet    Refill:  0    Order Specific Question:   Supervising Provider    Answer:   Quentin Angst [id number]    Follow-up: Return if symptoms worsen or fail to improve.   Lizbeth Bark FNP

## 2017-05-16 LAB — CBC WITH DIFFERENTIAL/PLATELET
BASOS: 0 %
Basophils Absolute: 0 10*3/uL (ref 0.0–0.2)
EOS (ABSOLUTE): 0 10*3/uL (ref 0.0–0.4)
Eos: 0 %
HEMATOCRIT: 39.5 % (ref 34.0–46.6)
HEMOGLOBIN: 13.6 g/dL (ref 11.1–15.9)
IMMATURE GRANS (ABS): 0 10*3/uL (ref 0.0–0.1)
Immature Granulocytes: 0 %
LYMPHS ABS: 3.2 10*3/uL — AB (ref 0.7–3.1)
LYMPHS: 48 %
MCH: 30.6 pg (ref 26.6–33.0)
MCHC: 34.4 g/dL (ref 31.5–35.7)
MCV: 89 fL (ref 79–97)
MONOCYTES: 7 %
Monocytes Absolute: 0.4 10*3/uL (ref 0.1–0.9)
NEUTROS ABS: 3 10*3/uL (ref 1.4–7.0)
Neutrophils: 45 %
Platelets: 232 10*3/uL (ref 150–379)
RBC: 4.44 x10E6/uL (ref 3.77–5.28)
RDW: 12.8 % (ref 12.3–15.4)
WBC: 6.7 10*3/uL (ref 3.4–10.8)

## 2017-05-16 LAB — VITAMIN D 25 HYDROXY (VIT D DEFICIENCY, FRACTURES): Vit D, 25-Hydroxy: 18.7 ng/mL — ABNORMAL LOW (ref 30.0–100.0)

## 2017-05-16 LAB — TSH: TSH: 1.6 u[IU]/mL (ref 0.450–4.500)

## 2017-05-22 ENCOUNTER — Other Ambulatory Visit: Payer: Self-pay | Admitting: Family Medicine

## 2017-05-22 DIAGNOSIS — E559 Vitamin D deficiency, unspecified: Secondary | ICD-10-CM

## 2017-05-22 MED ORDER — VITAMIN D (ERGOCALCIFEROL) 1.25 MG (50000 UNIT) PO CAPS: ORAL_CAPSULE | ORAL | 0 refills | Status: DC

## 2017-05-27 ENCOUNTER — Telehealth: Payer: Self-pay

## 2017-05-27 NOTE — Telephone Encounter (Signed)
-----   Message from Lizbeth BarkMandesia R Hairston, FNP sent at 05/22/2017  1:27 PM EDT ----- Vitamin D level was low. Hair loss can be a symptom of vitamin D deficiency. You were prescribed ergocalciferol (capsules) to increase your vitamin-d level.  Labs that evaluated your blood cells is normal. No signs of anemia, acute infection, or inflammation present.  Thyroid function normal. When underfunctioning it can cause hair loss.

## 2017-05-27 NOTE — Telephone Encounter (Signed)
CMA call regarding lab results   Patient verify DOB  Patient was aware and understood  

## 2017-05-29 ENCOUNTER — Ambulatory Visit: Payer: Medicaid Other | Admitting: Family Medicine

## 2018-01-24 ENCOUNTER — Emergency Department (HOSPITAL_COMMUNITY)
Admission: EM | Admit: 2018-01-24 | Discharge: 2018-01-25 | Disposition: A | Discharge status: Home / Self Care | Payer: No Typology Code available for payment source | Attending: Emergency Medicine | Admitting: Emergency Medicine

## 2018-01-24 ENCOUNTER — Emergency Department (HOSPITAL_COMMUNITY): Payer: No Typology Code available for payment source

## 2018-01-24 ENCOUNTER — Encounter (HOSPITAL_COMMUNITY): Payer: Self-pay | Admitting: *Deleted

## 2018-01-24 ENCOUNTER — Other Ambulatory Visit: Payer: Self-pay

## 2018-01-24 DIAGNOSIS — R0789 Other chest pain: Secondary | ICD-10-CM | POA: Diagnosis not present

## 2018-01-24 DIAGNOSIS — M25561 Pain in right knee: Secondary | ICD-10-CM | POA: Insufficient documentation

## 2018-01-24 DIAGNOSIS — Y92410 Unspecified street and highway as the place of occurrence of the external cause: Secondary | ICD-10-CM | POA: Diagnosis not present

## 2018-01-24 DIAGNOSIS — R1012 Left upper quadrant pain: Secondary | ICD-10-CM | POA: Diagnosis present

## 2018-01-24 DIAGNOSIS — Z7901 Long term (current) use of anticoagulants: Secondary | ICD-10-CM | POA: Insufficient documentation

## 2018-01-24 DIAGNOSIS — M7918 Myalgia, other site: Secondary | ICD-10-CM

## 2018-01-24 DIAGNOSIS — M542 Cervicalgia: Secondary | ICD-10-CM | POA: Diagnosis not present

## 2018-01-24 DIAGNOSIS — M545 Low back pain: Secondary | ICD-10-CM | POA: Insufficient documentation

## 2018-01-24 DIAGNOSIS — Y939 Activity, unspecified: Secondary | ICD-10-CM | POA: Insufficient documentation

## 2018-01-24 DIAGNOSIS — Y999 Unspecified external cause status: Secondary | ICD-10-CM | POA: Diagnosis not present

## 2018-01-24 LAB — COMPREHENSIVE METABOLIC PANEL
ALBUMIN: 4.4 g/dL (ref 3.5–5.0)
ALT: 71 U/L — AB (ref 14–54)
ANION GAP: 7 (ref 5–15)
AST: 49 U/L — AB (ref 15–41)
Alkaline Phosphatase: 47 U/L (ref 38–126)
BILIRUBIN TOTAL: 0.9 mg/dL (ref 0.3–1.2)
BUN: 9 mg/dL (ref 6–20)
CALCIUM: 9.3 mg/dL (ref 8.9–10.3)
CO2: 25 mmol/L (ref 22–32)
Chloride: 107 mmol/L (ref 101–111)
Creatinine, Ser: 0.71 mg/dL (ref 0.44–1.00)
Glucose, Bld: 86 mg/dL (ref 65–99)
POTASSIUM: 4.1 mmol/L (ref 3.5–5.1)
SODIUM: 139 mmol/L (ref 135–145)
TOTAL PROTEIN: 7.4 g/dL (ref 6.5–8.1)

## 2018-01-24 LAB — CBC
HCT: 38.3 % (ref 36.0–46.0)
Hemoglobin: 13.2 g/dL (ref 12.0–15.0)
MCH: 31.2 pg (ref 26.0–34.0)
MCHC: 34.5 g/dL (ref 30.0–36.0)
MCV: 90.5 fL (ref 78.0–100.0)
PLATELETS: 211 10*3/uL (ref 150–400)
RBC: 4.23 MIL/uL (ref 3.87–5.11)
RDW: 12.6 % (ref 11.5–15.5)
WBC: 8.2 10*3/uL (ref 4.0–10.5)

## 2018-01-24 LAB — URINALYSIS, ROUTINE W REFLEX MICROSCOPIC
Bilirubin Urine: NEGATIVE
GLUCOSE, UA: NEGATIVE mg/dL
HGB URINE DIPSTICK: NEGATIVE
KETONES UR: NEGATIVE mg/dL
NITRITE: NEGATIVE
PH: 6 (ref 5.0–8.0)
Protein, ur: NEGATIVE mg/dL
Specific Gravity, Urine: 1.015 (ref 1.005–1.030)

## 2018-01-24 LAB — PREGNANCY, URINE: Preg Test, Ur: NEGATIVE

## 2018-01-24 LAB — LIPASE, BLOOD: LIPASE: 37 U/L (ref 11–51)

## 2018-01-24 MED ORDER — IOHEXOL 300 MG/ML  SOLN
100.0000 mL | Freq: Once | INTRAMUSCULAR | Status: AC | PRN
  Administered 2018-01-24: 100 mL via INTRAVENOUS
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
## 2018-01-24 NOTE — ED Notes (Signed)
Patient transported to X-ray 
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
## 2018-01-24 NOTE — ED Notes (Signed)
Pt is one touch pt, see Provider's noted for assessment

## 2018-01-25 MED ORDER — IBUPROFEN 600 MG PO TABS: 600.0000 mg | ORAL_TABLET | Freq: Four times a day (QID) | ORAL | 0 refills | Status: DC | PRN

## 2018-01-25 MED ORDER — METHOCARBAMOL 500 MG PO TABS: 500.0000 mg | ORAL_TABLET | Freq: Two times a day (BID) | ORAL | 0 refills | Status: DC

## 2018-01-25 NOTE — Discharge Instructions (Signed)
The pain your experiencing is likely due to muscle strain, you may take Ibuprofen and Robaxin as needed for pain management. Do not combine with any pain reliever other than tylenol. The muscle soreness should improve over the next week. Follow up with your family doctor in the next week for a recheck if you are still having symptoms. Return to ED if pain is worsening, you develop weakness or numbness of extremities, or new or concerning symptoms develop. °

## 2018-03-12 ENCOUNTER — Encounter (HOSPITAL_COMMUNITY): Payer: Self-pay | Admitting: Emergency Medicine

## 2018-03-12 ENCOUNTER — Ambulatory Visit (HOSPITAL_COMMUNITY)
Admission: EM | Admit: 2018-03-12 | Discharge: 2018-03-12 | Disposition: A | Discharge status: Home / Self Care | Payer: Medicaid Other | Attending: Family Medicine | Admitting: Family Medicine

## 2018-03-12 DIAGNOSIS — R140 Abdominal distension (gaseous): Secondary | ICD-10-CM | POA: Diagnosis not present

## 2018-03-12 DIAGNOSIS — L2084 Intrinsic (allergic) eczema: Secondary | ICD-10-CM | POA: Diagnosis not present

## 2018-03-12 MED ORDER — TRIAMCINOLONE ACETONIDE 0.1 % EX CREA: 1.0000 "application " | TOPICAL_CREAM | Freq: Two times a day (BID) | CUTANEOUS | 0 refills | Status: DC

## 2018-03-12 NOTE — Discharge Instructions (Addendum)
For the rash, I recommend use the cream prescribed twice a day for 3 weeks while discontinuing the use of the necklace.  If the rash goes away, stop the cream and wait for 3 weeks to see if it comes back.  If it does not come back, he may resume the necklace.  If the rash does not go away, please return here.  For the bloating,, I recommend that you follow-up with [facility] to ask for a pelvic ultrasound to make sure you do not have fibroids.  It may be that the weight gain since her fourth child is causing some of the bloating.

## 2018-03-12 NOTE — ED Triage Notes (Signed)
Pt c/o rash on the L side of her face, pt also c/o stomach is "growing".
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
## 2018-05-12 ENCOUNTER — Encounter: Payer: Self-pay | Admitting: Internal Medicine

## 2018-05-12 ENCOUNTER — Ambulatory Visit: Payer: Self-pay | Attending: Internal Medicine | Admitting: Internal Medicine

## 2018-05-12 VITALS — BP 110/79 | HR 83 | Temp 98.7°F | Resp 16 | Ht 59.0 in | Wt 155.4 lb

## 2018-05-12 DIAGNOSIS — E65 Localized adiposity: Secondary | ICD-10-CM

## 2018-05-12 DIAGNOSIS — N898 Other specified noninflammatory disorders of vagina: Secondary | ICD-10-CM

## 2018-05-12 DIAGNOSIS — Z23 Encounter for immunization: Secondary | ICD-10-CM

## 2018-05-12 DIAGNOSIS — L259 Unspecified contact dermatitis, unspecified cause: Secondary | ICD-10-CM

## 2018-05-12 DIAGNOSIS — Z6830 Body mass index (BMI) 30.0-30.9, adult: Secondary | ICD-10-CM | POA: Insufficient documentation

## 2018-05-12 DIAGNOSIS — K644 Residual hemorrhoidal skin tags: Secondary | ICD-10-CM

## 2018-05-12 DIAGNOSIS — L989 Disorder of the skin and subcutaneous tissue, unspecified: Secondary | ICD-10-CM | POA: Insufficient documentation

## 2018-05-12 DIAGNOSIS — E6609 Other obesity due to excess calories: Secondary | ICD-10-CM

## 2018-05-12 DIAGNOSIS — Z114 Encounter for screening for human immunodeficiency virus [HIV]: Secondary | ICD-10-CM

## 2018-05-12 DIAGNOSIS — Z86718 Personal history of other venous thrombosis and embolism: Secondary | ICD-10-CM | POA: Insufficient documentation

## 2018-05-12 MED ORDER — HYDROCORTISONE 2.5 % EX CREA: TOPICAL_CREAM | Freq: Two times a day (BID) | CUTANEOUS | 1 refills | Status: DC

## 2018-05-12 MED ORDER — HYDROCORTISONE ACETATE 25 MG RE SUPP: 25.0000 mg | Freq: Every day | RECTAL | 0 refills | Status: DC | PRN

## 2018-05-12 NOTE — Patient Instructions (Addendum)
Follow a Healthy Eating Plan - You can do it! Limit sugary drinks.  Avoid sodas, sweet tea, sport or energy drinks, or fruit drinks.  Drink water, lo-fat milk, or diet drinks. Limit snack foods.   Cut back on candy, cake, cookies, chips, ice cream.  These are a special treat, only in small amounts. Eat plenty of vegetables.  Especially dark green, red, and orange vegetables. Aim for at least 3 servings a day. More is better! Include fruit in your daily diet.  Whole fruit is much healthier than fruit juice! Limit "white" bread, "white" pasta, "white" rice.   Choose "100% whole grain" products, brown or wild rice. Avoid fatty meats. Try "Meatless Monday" and choose eggs or beans one day a week.  When eating meat, choose lean meats like chicken, turkey, and fish.  Grill, broil, or bake meats instead of frying, and eat poultry without the skin. Eat less salt.  Avoid frozen pizzas, frozen dinners and salty foods.  Use seasonings other than salt in cooking.  This can help blood pressure and keep you from swelling Beer, wine and liquor have calories.  If you can safely drink alcohol, limit to 1 drink per day for women, 2 drinks for men   Influenza Virus Vaccine injection (Fluarix) What is this medicine? INFLUENZA VIRUS VACCINE (in floo EN zuh VAHY ruhs vak SEEN) helps to reduce the risk of getting influenza also known as the flu. This medicine may be used for other purposes; ask your health care provider or pharmacist if you have questions. COMMON BRAND NAME(S): Fluarix, Fluzone What should I tell my health care provider before I take this medicine? They need to know if you have any of these conditions: -bleeding disorder like hemophilia -fever or infection -Guillain-Barre syndrome or other neurological problems -immune system problems -infection with the human immunodeficiency virus (HIV) or AIDS -low blood platelet counts -multiple sclerosis -an unusual or allergic reaction to influenza virus  vaccine, eggs, chicken proteins, latex, gentamicin, other medicines, foods, dyes or preservatives -pregnant or trying to get pregnant -breast-feeding How should I use this medicine? This vaccine is for injection into a muscle. It is given by a health care professional. A copy of Vaccine Information Statements will be given before each vaccination. Read this sheet carefully each time. The sheet may change frequently. Talk to your pediatrician regarding the use of this medicine in children. Special care may be needed. Overdosage: If you think you have taken too much of this medicine contact a poison control center or emergency room at once. NOTE: This medicine is only for you. Do not share this medicine with others. What if I miss a dose? This does not apply. What may interact with this medicine? -chemotherapy or radiation therapy -medicines that lower your immune system like etanercept, anakinra, infliximab, and adalimumab -medicines that treat or prevent blood clots like warfarin -phenytoin -steroid medicines like prednisone or cortisone -theophylline -vaccines This list may not describe all possible interactions. Give your health care provider a list of all the medicines, herbs, non-prescription drugs, or dietary supplements you use. Also tell them if you smoke, drink alcohol, or use illegal drugs. Some items may interact with your medicine. What should I watch for while using this medicine? Report any side effects that do not go away within 3 days to your doctor or health care professional. Call your health care provider if any unusual symptoms occur within 6 weeks of receiving this vaccine. You may still catch the flu, but the   illness is not usually as bad. You cannot get the flu from the vaccine. The vaccine will not protect against colds or other illnesses that may cause fever. The vaccine is needed every year. What side effects may I notice from receiving this medicine? Side effects  that you should report to your doctor or health care professional as soon as possible: -allergic reactions like skin rash, itching or hives, swelling of the face, lips, or tongue Side effects that usually do not require medical attention (report to your doctor or health care professional if they continue or are bothersome): -fever -headache -muscle aches and pains -pain, tenderness, redness, or swelling at site where injected -weak or tired This list may not describe all possible side effects. Call your doctor for medical advice about side effects. You may report side effects to FDA at 1-800-FDA-1088. Where should I keep my medicine? This vaccine is only given in a clinic, pharmacy, doctor's office, or other health care setting and will not be stored at home. NOTE: This sheet is a summary. It may not cover all possible information. If you have questions about this medicine, talk to your doctor, pharmacist, or health care provider.  2018 Elsevier/Gold Standard (2008-04-06 09:30:40)  

## 2018-05-12 NOTE — Progress Notes (Signed)
Patient ID: Gabriela Benton Plude, female    DOB: [birth year]  MRN: [id number]  CC: re-establish; Referral (Dermatologist/ plastic surgeon ); and Shortness of Breath   Subjective: Gabriela Benton Kozlov is a 27 y.o. female who presents to get reestablished with me as PCP.  Previous PCP was NP Hairston who has left the practice. Her concerns today include:   C/o having several genital warts that she would like to have treated.  "They come and go" x several mths.  Comes on after shaving and causes itching.  sexually active with one female partner in past 6 mths.  Does not use condoms.  C/o vaginal odor and brown dischg for a few days.   Also c/o having hemorrhoids x 4 yrs.  Started 4 yrs ago during pregnancy.  Painful at time.  No itching.  Some bleeding several mths ago.  Pain and burning when she has diarrhea  Wants referral to plastic surgery for liposuction on abdomen because of abdominal obesity. Has problem reaching back to wipe buttock after BM because she feels she is to obese around the center.  Also has problems shaving her genital area because she cannot see the area.  She does not get in any exercise outside of work.  She feels that her eating habits are good.   Patient Active Problem List   Diagnosis Date Noted  . DVT of axillary vein, acute right (HCC) 07/19/2015  . Anemia 07/19/2015  . Normal labor 02/18/2014  . Hx of preterm delivery, currently pregnant 02/18/2014  . Placental abruption, antepartum--2013 04/27/2012  . History of VBAC 04/21/2012  . H/O: cesarean section 04/21/2012     Current Outpatient Medications on File Prior to Visit  Medication Sig Dispense Refill  . Etonogestrel (NEXPLANON [place]) Inject into the skin.    [name]. triamcinolone cream (KENALOG) 0.1 % Apply 1 application topically 2 (two) times daily. (Patient not taking: Reported on 05/12/2018) 453 g 0   No current facility-administered medications on file prior to visit.     No Known Allergies  Social History    Socioeconomic History  . Marital status: Single    Spouse name: Not on file  . Number of children: Not on file  . Years of education: Not on file  . Highest education level: Not on file  Occupational History  . Not on file  Social Needs  . Financial resource strain: Not on file  . Food insecurity:    Worry: Not on file    Inability: Not on file  . Transportation needs:    Medical: Not on file    Non-medical: Not on file  Tobacco Use  . Smoking status: Never Smoker  . Smokeless tobacco: Never Used  Substance and Sexual Activity  . Alcohol use: No  . Drug use: No  . Sexual activity: Yes    Birth control/protection: Implant  Lifestyle  . Physical activity:    Days per week: Not on file    Minutes per session: Not on file  . Stress: Not on file  Relationships  . Social connections:    Talks on phone: Not on file    Gets together: Not on file    Attends religious service: Not on file    Active member of club or organization: Not on file    Attends meetings of clubs or organizations: Not on file    Relationship status: Not on file  . Intimate partner violence:    Fear of current or ex partner: Not on  file    Emotionally abused: Not on file    Physically abused: Not on file    Forced sexual activity: Not on file  Other Topics Concern  . Not on file  Social History Narrative  . Not on file    No family history on file.  Past Surgical History:  Procedure Laterality Date  . CESAREAN SECTION    . cesarian  2011  . THERAPEUTIC ABORTION      ROS: Review of Systems Negative except as above PHYSICAL EXAM: BP 110/79   Pulse 83   Temp 98.7 F (37.1 C) (Oral)   Resp 16   Ht 4\' 11"  (1.499 m)   Wt 155 lb 6.4 oz (70.5 kg)   SpO2 100%   BMI 31.39 kg/m   Physical Exam  General appearance - alert, well appearing, and in no distress Mental status - normal mood, behavior, speech, dress, motor activity, and thought processes Abdomen -abdomen is soft and nontender.   It is not protruding.  She does have some stretch marks GU: My CMA, Julius Bowels, is present.  Patient has scattered shaving bumps over the mons pubis and several close to the lips.  No genital warts seen.  No ulcers seen.  No inguinal lymphadenopathy. Rectum: She has 3 skin tags.  No hemorrhoids seen prolapsing out of the rectum with Valsalva.    ASSESSMENT AND PLAN: 1. Skin irritation from shaving Patient told that she may have to discontinue shaving if it causes this much irritation for her.  She uses soap and water when she shaves.  I have given some hydrocortisone cream to use to try and soothe the area. - hydrocortisone 2.5 % cream; Apply topically 2 (two) times daily.  Dispense: 30 g; Refill: 1  2. Skin tag of anus Discussed the importance of keeping bowel movements soft and regular to prevent hemorrhoids.  Recommend increase intake of dietary fiber.  When she feels she has a flareup of hemorrhoids sitz bath recommended.  I have also given Anusol suppository to use as needed - hydrocortisone (ANUSOL-HC) 25 MG suppository; Place 1 suppository (25 mg total) rectally daily as needed for hemorrhoids or anal itching.  Dispense: 12 suppository; Refill: 0  3. Class 1 obesity due to excess calories without serious comorbidity with body mass index (BMI) of 30.0 to 30.9 in adult -Dietary counseling given.  Advised to cut back on sugary drinks including soda and juices, avoid eating fried foods.  Cut back on white carbohydrates.  Try to get in some form of aerobic exercise at least 4 days a week for 45 minutes.  4. Vaginal odor - Urine cytology ancillary only  5. Need for immunization against influenza - Flu Vaccine QUAD 36+ mos IM  6. Screening for HIV (human immunodeficiency virus) - HIV antibody  7. Abdominal obesity - Ambulatory referral to Plastic Surgery   Patient was given the opportunity to ask questions.  Patient verbalized understanding of the plan and was able to repeat key elements of  the plan.   Orders Placed This Encounter  Procedures  . Flu Vaccine QUAD 36+ mos IM  . HIV antibody  . Ambulatory referral to Plastic Surgery     Requested Prescriptions   Signed Prescriptions Disp Refills  . hydrocortisone (ANUSOL-HC) 25 MG suppository 12 suppository 0    Sig: Place 1 suppository (25 mg total) rectally daily as needed for hemorrhoids or anal itching.  . hydrocortisone 2.5 % cream 30 g 1    Sig: Apply topically  2 (two) times daily.    Return if symptoms worsen or fail to improve.  Jonah Blueeborah Saba Gomm, MD, FACP

## 2018-05-12 NOTE — Progress Notes (Signed)
Pt states she has warts that grows down in her private area and it does get painful  Pt states she has hemorrhoids

## 2018-05-13 ENCOUNTER — Other Ambulatory Visit: Payer: Self-pay | Admitting: Internal Medicine

## 2018-05-13 ENCOUNTER — Telehealth: Payer: Self-pay

## 2018-05-13 LAB — URINE CYTOLOGY ANCILLARY ONLY
BACTERIAL VAGINITIS: NEGATIVE
CHLAMYDIA, DNA PROBE: NEGATIVE
Candida vaginitis: NEGATIVE
Neisseria Gonorrhea: NEGATIVE
TRICH (WINDOWPATH): POSITIVE — AB

## 2018-05-13 LAB — HIV ANTIBODY (ROUTINE TESTING W REFLEX): HIV SCREEN 4TH GENERATION: NONREACTIVE

## 2018-05-13 MED ORDER — METRONIDAZOLE 500 MG PO TABS: 2000.0000 mg | ORAL_TABLET | Freq: Once | ORAL | 0 refills | Status: AC

## 2018-05-13 NOTE — Telephone Encounter (Signed)
Contacted pt to go over pt didn't answer lvm asking pt to give me a call at her earliest convenience

## 2018-05-18 ENCOUNTER — Telehealth: Payer: Self-pay

## 2018-05-18 NOTE — Telephone Encounter (Signed)
Contacted pt to go over urine results pt didn't answer lvm asking pt to give me a call at her earliest convenience  Will send Report to [facility]

## 2018-09-30 ENCOUNTER — Inpatient Hospital Stay: Payer: Medicaid Other | Admitting: Family Medicine

## 2018-10-30 ENCOUNTER — Encounter (HOSPITAL_COMMUNITY): Payer: Self-pay | Admitting: Emergency Medicine

## 2018-10-30 ENCOUNTER — Other Ambulatory Visit: Payer: Self-pay

## 2018-10-30 ENCOUNTER — Ambulatory Visit (HOSPITAL_COMMUNITY)
Admission: EM | Admit: 2018-10-30 | Discharge: 2018-10-30 | Disposition: A | Discharge status: Home / Self Care | Payer: Self-pay | Attending: Family Medicine | Admitting: Family Medicine

## 2018-10-30 DIAGNOSIS — K625 Hemorrhage of anus and rectum: Secondary | ICD-10-CM

## 2018-10-30 DIAGNOSIS — N898 Other specified noninflammatory disorders of vagina: Secondary | ICD-10-CM

## 2018-10-30 DIAGNOSIS — K644 Residual hemorrhoidal skin tags: Secondary | ICD-10-CM

## 2018-10-30 MED ORDER — HYDROCORTISONE 2.5 % RE CREA: TOPICAL_CREAM | RECTAL | 1 refills | Status: DC

## 2018-10-30 NOTE — ED Triage Notes (Signed)
The patient presented to the [facility] with a complaint of a vaginal discharge and rectal pain x 1 week.

## 2018-10-30 NOTE — Discharge Instructions (Addendum)
We did lab testing during this visit. Vaginal culture.  If there are any abnormal findings that require change in medicine or indicate a positive result, you will be notified.  If all of your tests are normal, you will not be called.    For the hemorrhoid and bleeding: drink more water.  Eat high fiber diet or add fiber supplement.  Avoid constipation.  When inflamed use moist wipe in place of paper.  Use rectal cream if irritated.    Follow up with your PCP
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
## 2018-11-02 LAB — CERVICOVAGINAL ANCILLARY ONLY
Bacterial vaginitis: NEGATIVE
CANDIDA VAGINITIS: POSITIVE — AB
Chlamydia: NEGATIVE
Neisseria Gonorrhea: NEGATIVE
Trichomonas: NEGATIVE

## 2018-11-03 ENCOUNTER — Telehealth (HOSPITAL_COMMUNITY): Payer: Self-pay | Admitting: Emergency Medicine

## 2018-11-03 MED ORDER — FLUCONAZOLE 150 MG PO TABS: 150.0000 mg | ORAL_TABLET | Freq: Once | ORAL | 0 refills | Status: AC

## 2018-11-03 NOTE — Telephone Encounter (Signed)
Test for candida (yeast) was positive.  Prescription for fluconazole 150mg po now, repeat dose in 3d if needed, #2 no refills, sent to the pharmacy of record.  Recheck or followup with PCP for further evaluation if symptoms are not improving.    Attempted to reach patient. No answer at this time. Voicemail left.     

## 2018-11-05 ENCOUNTER — Telehealth (HOSPITAL_COMMUNITY): Payer: Self-pay | Admitting: Emergency Medicine

## 2018-11-05 NOTE — Telephone Encounter (Signed)
Attempted to reach patient x2. No answer at this time. Voicemail left.    

## 2018-11-05 NOTE — Telephone Encounter (Signed)
Patient contacted and made aware of all results, all questions answered.   

## 2019-01-04 ENCOUNTER — Emergency Department (HOSPITAL_COMMUNITY)
Admission: EM | Admit: 2019-01-04 | Discharge: 2019-01-04 | Disposition: A | Discharge status: Home / Self Care | Payer: Medicaid Other | Attending: Emergency Medicine | Admitting: Emergency Medicine

## 2019-01-04 ENCOUNTER — Encounter (HOSPITAL_COMMUNITY): Payer: Self-pay | Admitting: Emergency Medicine

## 2019-01-04 ENCOUNTER — Other Ambulatory Visit: Payer: Self-pay

## 2019-01-04 DIAGNOSIS — B9789 Other viral agents as the cause of diseases classified elsewhere: Secondary | ICD-10-CM

## 2019-01-04 DIAGNOSIS — J069 Acute upper respiratory infection, unspecified: Secondary | ICD-10-CM | POA: Insufficient documentation

## 2019-01-04 DIAGNOSIS — Z86718 Personal history of other venous thrombosis and embolism: Secondary | ICD-10-CM | POA: Insufficient documentation

## 2019-01-04 DIAGNOSIS — R0602 Shortness of breath: Secondary | ICD-10-CM | POA: Insufficient documentation
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
## 2019-01-04 NOTE — ED Triage Notes (Signed)
Pt. Stated, Ive had cough and sore throat for about a week. My Dr. Has sent me here to have me tested for Covid

## 2019-01-04 NOTE — Discharge Instructions (Signed)
Please read attached information. If you experience any new or worsening signs or symptoms please return to the emergency room for evaluation. Please follow-up with your primary care provider or specialist as discussed.  °

## 2019-02-07 ENCOUNTER — Emergency Department (HOSPITAL_COMMUNITY): Payer: Self-pay

## 2019-02-07 ENCOUNTER — Other Ambulatory Visit: Payer: Self-pay

## 2019-02-07 ENCOUNTER — Emergency Department (HOSPITAL_COMMUNITY)
Admission: EM | Admit: 2019-02-07 | Discharge: 2019-02-08 | Disposition: A | Discharge status: Home / Self Care | Payer: Self-pay | Attending: Emergency Medicine | Admitting: Emergency Medicine

## 2019-02-07 ENCOUNTER — Encounter (HOSPITAL_COMMUNITY): Payer: Self-pay | Admitting: *Deleted

## 2019-02-07 DIAGNOSIS — R197 Diarrhea, unspecified: Secondary | ICD-10-CM | POA: Insufficient documentation

## 2019-02-07 DIAGNOSIS — H5713 Ocular pain, bilateral: Secondary | ICD-10-CM | POA: Insufficient documentation

## 2019-02-07 DIAGNOSIS — Z86718 Personal history of other venous thrombosis and embolism: Secondary | ICD-10-CM | POA: Insufficient documentation

## 2019-02-07 DIAGNOSIS — M7918 Myalgia, other site: Secondary | ICD-10-CM | POA: Insufficient documentation

## 2019-02-07 DIAGNOSIS — R519 Headache, unspecified: Secondary | ICD-10-CM

## 2019-02-07 DIAGNOSIS — R51 Headache: Secondary | ICD-10-CM | POA: Insufficient documentation

## 2019-02-07 LAB — CBC WITH DIFFERENTIAL/PLATELET
Abs Immature Granulocytes: 0.02 10*3/uL (ref 0.00–0.07)
Basophils Absolute: 0 10*3/uL (ref 0.0–0.1)
Basophils Relative: 0 %
Eosinophils Absolute: 0 10*3/uL (ref 0.0–0.5)
Eosinophils Relative: 0 %
HCT: 42 % (ref 36.0–46.0)
Hemoglobin: 14.2 g/dL (ref 12.0–15.0)
Immature Granulocytes: 0 %
Lymphocytes Relative: 24 %
Lymphs Abs: 1.9 10*3/uL (ref 0.7–4.0)
MCH: 30.3 pg (ref 26.0–34.0)
MCHC: 33.8 g/dL (ref 30.0–36.0)
MCV: 89.7 fL (ref 80.0–100.0)
Monocytes Absolute: 0.6 10*3/uL (ref 0.1–1.0)
Monocytes Relative: 7 %
Neutro Abs: 5.5 10*3/uL (ref 1.7–7.7)
Neutrophils Relative %: 69 %
Platelets: 238 10*3/uL (ref 150–400)
RBC: 4.68 MIL/uL (ref 3.87–5.11)
RDW: 11.8 % (ref 11.5–15.5)
WBC: 8.1 10*3/uL (ref 4.0–10.5)
nRBC: 0 % (ref 0.0–0.2)

## 2019-02-07 LAB — BASIC METABOLIC PANEL
Anion gap: 9 (ref 5–15)
BUN: 9 mg/dL (ref 6–20)
CO2: 21 mmol/L — ABNORMAL LOW (ref 22–32)
Calcium: 9.3 mg/dL (ref 8.9–10.3)
Chloride: 106 mmol/L (ref 98–111)
Creatinine, Ser: 0.85 mg/dL (ref 0.44–1.00)
GFR calc Af Amer: >60 mL/min (ref >60)
GFR calc non Af Amer: >60 mL/min (ref >60)
Glucose, Bld: 68 mg/dL — ABNORMAL LOW (ref 70–99)
Potassium: 3.7 mmol/L (ref 3.5–5.1)
Sodium: 136 mmol/L (ref 135–145)

## 2019-02-07 LAB — I-STAT BETA HCG BLOOD, ED (MC, WL, AP ONLY): I-stat hCG, quantitative: <5 m[IU]/mL (ref <5)

## 2019-02-07 MED ORDER — DIPHENHYDRAMINE HCL 50 MG/ML IJ SOLN
25.0000 mg | Freq: Once | INTRAMUSCULAR | Status: AC
  Administered 2019-02-07: 25 mg via INTRAVENOUS
  Filled 2019-02-07: qty 1

## 2019-02-07 MED ORDER — PROCHLORPERAZINE EDISYLATE 10 MG/2ML IJ SOLN
10.0000 mg | Freq: Once | INTRAMUSCULAR | Status: AC
  Administered 2019-02-07: 21:00:00 10 mg via INTRAVENOUS
  Filled 2019-02-07: qty 2

## 2019-02-07 MED ORDER — SODIUM CHLORIDE 0.9 % IV BOLUS
1000.0000 mL | Freq: Once | INTRAVENOUS | Status: AC
  Administered 2019-02-07: 21:00:00 1000 mL via INTRAVENOUS

## 2019-02-07 MED ORDER — IOHEXOL 300 MG/ML  SOLN
75.0000 mL | Freq: Once | INTRAMUSCULAR | Status: AC | PRN
  Administered 2019-02-07: 75 mL via INTRAVENOUS

## 2019-02-07 MED ORDER — DEXAMETHASONE SODIUM PHOSPHATE 10 MG/ML IJ SOLN
10.0000 mg | Freq: Once | INTRAMUSCULAR | Status: AC
  Administered 2019-02-07: 10 mg via INTRAVENOUS
  Filled 2019-02-07: qty 1

## 2019-02-07 NOTE — ED Notes (Signed)
Pt ambulatory to bathroom with no reported issues. 

## 2019-02-07 NOTE — ED Notes (Signed)
Patient transported to CT 

## 2019-02-07 NOTE — ED Notes (Signed)
Nurse starting IV and will draw labs. 
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
## 2019-02-07 NOTE — ED Triage Notes (Signed)
C/o headache since Monday, pain in bilateral eyes. No change in vision, dizziness, or nausea. Has had tylenol with out relief. No fevers.

## 2019-02-08 MED ORDER — ACETAMINOPHEN 500 MG PO TABS
1000.0000 mg | ORAL_TABLET | Freq: Once | ORAL | Status: AC
  Administered 2019-02-08: 1000 mg via ORAL
  Filled 2019-02-08: qty 2

## 2019-02-08 MED ORDER — IBUPROFEN 600 MG PO TABS: 600.0000 mg | ORAL_TABLET | Freq: Four times a day (QID) | ORAL | 0 refills | Status: DC | PRN

## 2019-02-08 NOTE — Discharge Instructions (Signed)
Your work-up today was reassuring.  Drink plenty fluids and get plenty of rest. You can alternate 600 mg of ibuprofen and [phone] mg of Tylenol every 3 hours as needed for pain. Do not exceed 4000 mg of Tylenol daily.  Take ibuprofen with food to avoid upset stomach issues.  Low up with your primary care physician or neurologist for reevaluation of your symptoms.  Return to the emergency department immediately if any concerning signs or symptoms develop such as high fevers, neck stiffness, weakness to one side of the body, persistent vomiting, altered mental status, or passing out.

## 2019-08-12 ENCOUNTER — Ambulatory Visit: Payer: Medicaid Other

## 2019-12-21 ENCOUNTER — Ambulatory Visit: Payer: Medicaid Other | Admitting: Obstetrics & Gynecology

## 2020-01-20 ENCOUNTER — Other Ambulatory Visit (HOSPITAL_COMMUNITY)
Admission: RE | Admit: 2020-01-20 | Discharge: 2020-01-20 | Disposition: A | Discharge status: Home / Self Care | Payer: Medicaid Other | Source: Ambulatory Visit | Attending: Obstetrics & Gynecology | Admitting: Obstetrics & Gynecology

## 2020-01-20 ENCOUNTER — Encounter: Payer: Self-pay | Admitting: Obstetrics and Gynecology

## 2020-01-20 ENCOUNTER — Other Ambulatory Visit (HOSPITAL_COMMUNITY)
Admission: RE | Admit: 2020-01-20 | Discharge: 2020-01-20 | Disposition: A | Discharge status: Home / Self Care | Payer: Medicaid Other | Source: Ambulatory Visit | Attending: Obstetrics and Gynecology | Admitting: Obstetrics and Gynecology

## 2020-01-20 ENCOUNTER — Ambulatory Visit (INDEPENDENT_AMBULATORY_CARE_PROVIDER_SITE_OTHER): Payer: Medicaid Other | Admitting: Obstetrics and Gynecology

## 2020-01-20 DIAGNOSIS — Z01419 Encounter for gynecological examination (general) (routine) without abnormal findings: Secondary | ICD-10-CM | POA: Insufficient documentation

## 2020-01-20 DIAGNOSIS — Z975 Presence of (intrauterine) contraceptive device: Secondary | ICD-10-CM | POA: Diagnosis not present

## 2020-01-20 DIAGNOSIS — Z202 Contact with and (suspected) exposure to infections with a predominantly sexual mode of transmission: Secondary | ICD-10-CM | POA: Insufficient documentation

## 2020-01-20 NOTE — Progress Notes (Signed)
Pt presents for annual, pap, and all STD testing. Pt requests Nexplanon removal today d/t fatigue.

## 2020-01-20 NOTE — Patient Instructions (Signed)
Health Maintenance, Female Adopting a healthy lifestyle and getting preventive care are important in promoting health and wellness. Ask your health care provider about:  The right schedule for you to have regular tests and exams.  Things you can do on your own to prevent diseases and keep yourself healthy. What should I know about diet, weight, and exercise? Eat a healthy diet   Eat a diet that includes plenty of vegetables, fruits, low-fat dairy products, and lean protein.  Do not eat a lot of foods that are high in solid fats, added sugars, or sodium. Maintain a healthy weight Body mass index (BMI) is used to identify weight problems. It estimates body fat based on height and weight. Your health care provider can help determine your BMI and help you achieve or maintain a healthy weight. Get regular exercise Get regular exercise. This is one of the most important things you can do for your health. Most adults should:  Exercise for at least 150 minutes each week. The exercise should increase your heart rate and make you sweat (moderate-intensity exercise).  Do strengthening exercises at least twice a week. This is in addition to the moderate-intensity exercise.  Spend less time sitting. Even light physical activity can be beneficial. Watch cholesterol and blood lipids Have your blood tested for lipids and cholesterol at 28 years of age, then have this test every 5 years. Have your cholesterol levels checked more often if:  Your lipid or cholesterol levels are high.  You are older than 28 years of age.  You are at high risk for heart disease. What should I know about cancer screening? Depending on your health history and family history, you may need to have cancer screening at various ages. This may include screening for:  Breast cancer.  Cervical cancer.  Colorectal cancer.  Skin cancer.  Lung cancer. What should I know about heart disease, diabetes, and high blood  pressure? Blood pressure and heart disease  High blood pressure causes heart disease and increases the risk of stroke. This is more likely to develop in people who have high blood pressure readings, are of African descent, or are overweight.  Have your blood pressure checked: ? Every 3-5 years if you are 18-39 years of age. ? Every year if you are 40 years old or older. Diabetes Have regular diabetes screenings. This checks your fasting blood sugar level. Have the screening done:  Once every three years after age 40 if you are at a normal weight and have a low risk for diabetes.  More often and at a younger age if you are overweight or have a high risk for diabetes. What should I know about preventing infection? Hepatitis B If you have a higher risk for hepatitis B, you should be screened for this virus. Talk with your health care provider to find out if you are at risk for hepatitis B infection. Hepatitis C Testing is recommended for:  Everyone born from 1945 through 1965.  Anyone with known risk factors for hepatitis C. Sexually transmitted infections (STIs)  Get screened for STIs, including gonorrhea and chlamydia, if: ? You are sexually active and are younger than 28 years of age. ? You are older than 28 years of age and your health care provider tells you that you are at risk for this type of infection. ? Your sexual activity has changed since you were last screened, and you are at increased risk for chlamydia or gonorrhea. Ask your health care provider if   you are at risk.  Ask your health care provider about whether you are at high risk for HIV. Your health care provider may recommend a prescription medicine to help prevent HIV infection. If you choose to take medicine to prevent HIV, you should first get tested for HIV. You should then be tested every 3 months for as long as you are taking the medicine. Pregnancy  If you are about to stop having your period (premenopausal) and  you may become pregnant, seek counseling before you get pregnant.  Take 400 to 800 micrograms (mcg) of folic acid every day if you become pregnant.  Ask for birth control (contraception) if you want to prevent pregnancy. Osteoporosis and menopause Osteoporosis is a disease in which the bones lose minerals and strength with aging. This can result in bone fractures. If you are 65 years old or older, or if you are at risk for osteoporosis and fractures, ask your health care provider if you should:  Be screened for bone loss.  Take a calcium or vitamin D supplement to lower your risk of fractures.  Be given hormone replacement therapy (HRT) to treat symptoms of menopause. Follow these instructions at home: Lifestyle  Do not use any products that contain nicotine or tobacco, such as cigarettes, e-cigarettes, and chewing tobacco. If you need help quitting, ask your health care provider.  Do not use street drugs.  Do not share needles.  Ask your health care provider for help if you need support or information about quitting drugs. Alcohol use  Do not drink alcohol if: ? Your health care provider tells you not to drink. ? You are pregnant, may be pregnant, or are planning to become pregnant.  If you drink alcohol: ? Limit how much you use to 0-1 drink a day. ? Limit intake if you are breastfeeding.  Be aware of how much alcohol is in your drink. In the [place] one drink equals one 12 oz bottle of beer (355 mL), one 5 oz glass of wine (148 mL), or one 1 oz glass of hard liquor (44 mL). General instructions  Schedule regular health, dental, and eye exams.  Stay current with your vaccines.  Tell your health care provider if: ? You often feel depressed. ? You have ever been abused or do not feel safe at home. Summary  Adopting a healthy lifestyle and getting preventive care are important in promoting health and wellness.  Follow your health care provider's instructions about healthy  diet, exercising, and getting tested or screened for diseases.  Follow your health care provider's instructions on monitoring your cholesterol and blood pressure. This information is not intended to replace advice given to you by your health care provider. Make sure you discuss any questions you have with your health care provider. Document Revised: 09/02/2018 Document Reviewed: 09/02/2018 Elsevier Patient Education  2020 Elsevier Inc.  
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
## 2020-01-21 LAB — CYTOLOGY - PAP: Diagnosis: NEGATIVE

## 2020-01-21 LAB — CERVICOVAGINAL ANCILLARY ONLY
Chlamydia: NEGATIVE
Comment: NEGATIVE
Comment: NEGATIVE
Comment: NORMAL
Neisseria Gonorrhea: NEGATIVE
Trichomonas: NEGATIVE

## 2020-01-21 LAB — RPR: RPR Ser Ql: NONREACTIVE

## 2020-01-21 LAB — HEPATITIS B SURFACE ANTIGEN: Hepatitis B Surface Ag: NEGATIVE

## 2020-01-21 LAB — HIV ANTIBODY (ROUTINE TESTING W REFLEX): HIV Screen 4th Generation wRfx: NONREACTIVE

## 2020-01-21 LAB — HEPATITIS C ANTIBODY: Hep C Virus Ab: <0.1 s/co ratio (ref 0.0–0.9)

## 2020-02-08 ENCOUNTER — Ambulatory Visit: Payer: Medicaid Other | Admitting: Obstetrics and Gynecology

## 2020-02-11 ENCOUNTER — Ambulatory Visit: Payer: Medicaid Other | Admitting: Family Medicine

## 2020-02-22 ENCOUNTER — Ambulatory Visit: Payer: Medicaid Other | Admitting: Obstetrics

## 2020-02-24 ENCOUNTER — Ambulatory Visit: Payer: Medicaid Other | Admitting: Obstetrics and Gynecology

## 2020-04-06 ENCOUNTER — Other Ambulatory Visit: Payer: Self-pay

## 2020-04-06 ENCOUNTER — Ambulatory Visit (INDEPENDENT_AMBULATORY_CARE_PROVIDER_SITE_OTHER): Payer: Medicaid Other | Admitting: Obstetrics

## 2020-04-06 ENCOUNTER — Encounter: Payer: Self-pay | Admitting: Obstetrics

## 2020-04-06 VITALS — BP 113/79 | HR 90 | Ht 59.0 in | Wt 171.8 lb

## 2020-04-06 DIAGNOSIS — Z3046 Encounter for surveillance of implantable subdermal contraceptive: Secondary | ICD-10-CM | POA: Diagnosis not present

## 2020-04-06 DIAGNOSIS — Z3202 Encounter for pregnancy test, result negative: Secondary | ICD-10-CM | POA: Diagnosis not present

## 2020-04-06 LAB — POCT URINE PREGNANCY: Preg Test, Ur: NEGATIVE

## 2020-04-06 MED ORDER — ETONOGESTREL 68 MG ~~LOC~~ IMPL
68.0000 mg | DRUG_IMPLANT | Freq: Once | SUBCUTANEOUS | Status: AC
  Administered 2020-04-06: 68 mg via SUBCUTANEOUS

## 2020-04-06 NOTE — Progress Notes (Signed)
Nexplanon Procedure Note    PROCEDURE: Nexplanon Removal and Reinsertion Performing Provider: Brock Bad, MD  Patient education prior to procedure, explained risk, benefits of Nexplanon, reviewed alternative options. Patient reported understanding. Gave consent to continue with procedure.  Patient had NEXPLANON inserted in 2018. Desires removal today w/ reinsertion  PROCEDURE:  Pregnancy Text :  not indicated Site (check):      left arm         Sterile Preparation:   Betadinex3 Lot # [id number]  Expiration Date 2023 SEP 26    The patient's left arm was palpated and the implant device located. The area was prepped with Betadinex3. The distal end of the device was palpated and 1.5 cc of 1% lidocaine without epinephrine was injected. A 1.5 mm incision was made. Any fibrotic tissue was carefully dissected away using blunt and/or sharp dissection. The device was removed in an intact manner.   Insertion site was the same as the removal site. Nexplanon  was inserted subcutaneously.Needle was removed from the insertion site. Nexplanon capsule was palpated by provider and patient to assure satisfactory placement.  A single suture of 4-0 Vicryl used to close incision.  Dressing applied.  Followup: The patient tolerated the procedure well without complications.  Standard post-procedure care is explained and return precautions are given.  Brock Bad, MD 04/06/20

## 2020-04-06 NOTE — Progress Notes (Signed)
Patient is in the office for nexplanon removal and re-insertion 

## 2020-04-20 ENCOUNTER — Encounter: Payer: Self-pay | Admitting: Obstetrics

## 2020-04-20 ENCOUNTER — Other Ambulatory Visit: Payer: Self-pay

## 2020-04-20 ENCOUNTER — Ambulatory Visit (INDEPENDENT_AMBULATORY_CARE_PROVIDER_SITE_OTHER): Payer: Medicaid Other | Admitting: Obstetrics

## 2020-04-20 VITALS — BP 123/82 | HR 90 | Wt 171.6 lb

## 2020-04-20 DIAGNOSIS — Z3046 Encounter for surveillance of implantable subdermal contraceptive: Secondary | ICD-10-CM | POA: Diagnosis not present

## 2020-04-20 NOTE — Progress Notes (Signed)
Pt is here for a follow up visit. Nexplanon placed on 04/06/2020. Pt denies any questions or concerns today.
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
## 2020-06-02 ENCOUNTER — Encounter (HOSPITAL_COMMUNITY): Payer: Self-pay

## 2020-06-02 ENCOUNTER — Ambulatory Visit (HOSPITAL_COMMUNITY)
Admission: EM | Admit: 2020-06-02 | Discharge: 2020-06-02 | Disposition: A | Discharge status: Home / Self Care | Payer: Self-pay | Attending: Urgent Care | Admitting: Urgent Care

## 2020-06-02 ENCOUNTER — Other Ambulatory Visit: Payer: Self-pay

## 2020-06-02 ENCOUNTER — Emergency Department (HOSPITAL_COMMUNITY)
Admission: EM | Admit: 2020-06-02 | Discharge: 2020-06-03 | Disposition: A | Discharge status: Home / Self Care | Payer: Medicaid Other | Attending: Emergency Medicine | Admitting: Emergency Medicine

## 2020-06-02 DIAGNOSIS — Z86718 Personal history of other venous thrombosis and embolism: Secondary | ICD-10-CM

## 2020-06-02 DIAGNOSIS — Z5321 Procedure and treatment not carried out due to patient leaving prior to being seen by health care provider: Secondary | ICD-10-CM | POA: Insufficient documentation

## 2020-06-02 DIAGNOSIS — M549 Dorsalgia, unspecified: Secondary | ICD-10-CM

## 2020-06-02 DIAGNOSIS — M542 Cervicalgia: Secondary | ICD-10-CM | POA: Insufficient documentation

## 2020-06-02 DIAGNOSIS — M25511 Pain in right shoulder: Secondary | ICD-10-CM | POA: Insufficient documentation

## 2020-06-02 DIAGNOSIS — R0789 Other chest pain: Secondary | ICD-10-CM

## 2020-06-02 DIAGNOSIS — M62838 Other muscle spasm: Secondary | ICD-10-CM

## 2020-06-02 DIAGNOSIS — S161XXA Strain of muscle, fascia and tendon at neck level, initial encounter: Secondary | ICD-10-CM

## 2020-06-02 DIAGNOSIS — R202 Paresthesia of skin: Secondary | ICD-10-CM | POA: Insufficient documentation

## 2020-06-02 LAB — CBC
HCT: 41.7 % (ref 36.0–46.0)
Hemoglobin: 13.6 g/dL (ref 12.0–15.0)
MCH: 30.2 pg (ref 26.0–34.0)
MCHC: 32.6 g/dL (ref 30.0–36.0)
MCV: 92.7 fL (ref 80.0–100.0)
Platelets: 264 10*3/uL (ref 150–400)
RBC: 4.5 MIL/uL (ref 3.87–5.11)
RDW: 12.5 % (ref 11.5–15.5)
WBC: 9.7 10*3/uL (ref 4.0–10.5)
nRBC: 0 % (ref 0.0–0.2)

## 2020-06-02 LAB — BASIC METABOLIC PANEL
Anion gap: 9 (ref 5–15)
BUN: 12 mg/dL (ref 6–20)
CO2: 25 mmol/L (ref 22–32)
Calcium: 9.7 mg/dL (ref 8.9–10.3)
Chloride: 104 mmol/L (ref 98–111)
Creatinine, Ser: 0.73 mg/dL (ref 0.44–1.00)
GFR calc Af Amer: >60 mL/min (ref >60)
GFR calc non Af Amer: >60 mL/min (ref >60)
Glucose, Bld: 97 mg/dL (ref 70–99)
Potassium: 4.7 mmol/L (ref 3.5–5.1)
Sodium: 138 mmol/L (ref 135–145)

## 2020-06-02 MED ORDER — TIZANIDINE HCL 4 MG PO TABS: 4.0000 mg | ORAL_TABLET | Freq: Three times a day (TID) | ORAL | 0 refills | Status: AC | PRN

## 2020-06-02 MED ORDER — PREDNISONE 20 MG PO TABS: ORAL_TABLET | ORAL | 0 refills | Status: AC

## 2020-06-02 NOTE — Discharge Instructions (Signed)
Please report to the emergency room now for definitive rule out of a recurrent clot in your right axillary vein/arm, chest clot given your chest pain.

## 2020-06-02 NOTE — ED Triage Notes (Signed)
Pt c/o 7/10 pain in right side of neck, right shoulder right arm, and right side of backx2 days Pt denies injury. Pt able to move all extremitites. Pt walked well to exam room.
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
## 2020-06-02 NOTE — ED Triage Notes (Addendum)
Pt arrives to ED w/ c/o 7/10 R neck and shoulder pain. Seen at UC earlier today for the same, told to come here for eval for potential blood clot. Pt endorses numbness of R arm, however, stroke screen negative.

## 2020-06-03 NOTE — ED Notes (Signed)
Pt left due to not being seen quick enough 

## 2020-06-05 ENCOUNTER — Ambulatory Visit: Payer: Self-pay

## 2020-06-05 NOTE — Telephone Encounter (Signed)
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 she states the pain shoots down her rt arm and rt chest. She states it is constant and now pain is 10.  She was seen at [facility] and they sent her to ER for evaluation last week but she didn't stay because of long waits.  She wanted to make an OV. Per protocol patient will go to Er for evaluation.  She was encouraged to stay for evaluation of possible injury. Care advice was read to patient.  She verbalized understanding of all information.  Reason for Disposition . Weakness of an arm or hand  Answer Assessment - Initial Assessment Questions 1. ONSET: "When did the pain begin?"      Last wednesday 2. LOCATION: "Where does it hurt?"      Rt neck 3. PATTERN "Does the pain come and go, or has it been constant since it started?"      constant and numb 4. SEVERITY: "How bad is the pain?"  (Scale 1-10; or mild, moderate, severe)   - NO PAIN (0): no pain or only slight stiffness    - MILD (1-3): doesn't interfere with normal activities    - MODERATE (4-7): interferes with normal activities or awakens from sleep    - SEVERE (8-10):  excruciating pain, unable to do any normal activities      10 5. RADIATION: "Does the pain go anywhere else, shoot into your arms?"    Down arm and shoulder  6. CORD SYMPTOMS: "Any weakness or numbness of the arms or legs?"    Numbness neck 7. CAUSE: "What do you think is causing the neck pain?"     Pulling on rsident 8. NECK OVERUSE: "Any recent activities that involved turning or twisting the neck?"     yes 9. OTHER SYMPTOMS: "Do you have any other symptoms?" (e.g., headache, fever, chest pain, difficulty breathing, neck swelling)     Rt side chest pain headache on and off rt side,knot in her neck 10. PREGNANCY: "Is there any chance you are pregnant?" "When was your last menstrual period?"       No nexplinon  Protocols used: NECK PAIN OR STIFFNESS-A-AH

## 2020-07-14 ENCOUNTER — Other Ambulatory Visit: Payer: Self-pay

## 2020-07-14 ENCOUNTER — Emergency Department (HOSPITAL_COMMUNITY)
Admission: EM | Admit: 2020-07-14 | Discharge: 2020-07-14 | Disposition: A | Discharge status: Home / Self Care | Payer: Self-pay | Attending: Emergency Medicine | Admitting: Emergency Medicine

## 2020-07-14 ENCOUNTER — Emergency Department (HOSPITAL_COMMUNITY): Payer: Self-pay

## 2020-07-14 ENCOUNTER — Encounter (HOSPITAL_COMMUNITY): Payer: Self-pay | Admitting: Emergency Medicine

## 2020-07-14 DIAGNOSIS — R0789 Other chest pain: Secondary | ICD-10-CM | POA: Insufficient documentation

## 2020-07-14 DIAGNOSIS — R072 Precordial pain: Secondary | ICD-10-CM

## 2020-07-14 LAB — I-STAT BETA HCG BLOOD, ED (MC, WL, AP ONLY): I-stat hCG, quantitative: <5 m[IU]/mL (ref <5)

## 2020-07-14 LAB — CBC
HCT: 41.3 % (ref 36.0–46.0)
Hemoglobin: 14.2 g/dL (ref 12.0–15.0)
MCH: 30.7 pg (ref 26.0–34.0)
MCHC: 34.4 g/dL (ref 30.0–36.0)
MCV: 89.2 fL (ref 80.0–100.0)
Platelets: 268 10*3/uL (ref 150–400)
RBC: 4.63 MIL/uL (ref 3.87–5.11)
RDW: 12 % (ref 11.5–15.5)
WBC: 6.2 10*3/uL (ref 4.0–10.5)
nRBC: 0 % (ref 0.0–0.2)

## 2020-07-14 LAB — BASIC METABOLIC PANEL
Anion gap: 11 (ref 5–15)
BUN: 5 mg/dL — ABNORMAL LOW (ref 6–20)
CO2: 20 mmol/L — ABNORMAL LOW (ref 22–32)
Calcium: 9.4 mg/dL (ref 8.9–10.3)
Chloride: 108 mmol/L (ref 98–111)
Creatinine, Ser: 0.93 mg/dL (ref 0.44–1.00)
GFR, Estimated: >60 mL/min (ref >60)
Glucose, Bld: 120 mg/dL — ABNORMAL HIGH (ref 70–99)
Potassium: 3.5 mmol/L (ref 3.5–5.1)
Sodium: 139 mmol/L (ref 135–145)

## 2020-07-14 LAB — TROPONIN I (HIGH SENSITIVITY)
Troponin I (High Sensitivity): <2 ng/L (ref <18)
Troponin I (High Sensitivity): <2 ng/L (ref <18)

## 2020-07-14 LAB — D-DIMER, QUANTITATIVE: D-Dimer, Quant: 0.63 ug/mL-FEU — ABNORMAL HIGH (ref 0.00–0.50)

## 2020-07-14 MED ORDER — IOHEXOL 350 MG/ML SOLN
75.0000 mL | Freq: Once | INTRAVENOUS | Status: AC | PRN
  Administered 2020-07-14: 75 mL via INTRAVENOUS

## 2020-07-14 MED ORDER — ACETAMINOPHEN 325 MG PO TABS
650.0000 mg | ORAL_TABLET | Freq: Once | ORAL | Status: AC
  Administered 2020-07-14: 650 mg via ORAL
  Filled 2020-07-14: qty 2

## 2020-07-14 NOTE — ED Notes (Signed)
Pt transported to CT ?
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
## 2020-07-14 NOTE — Discharge Instructions (Addendum)
It was our pleasure to provide your ER care today - we hope that you feel better.  Take acetaminophen or ibuprofen as need.  Follow up with primary care doctor in 1-2 weeks.   Return to ER if worse, new symptoms, persistent or recurrent chest pain, increased trouble breathing, or other concern.

## 2020-07-14 NOTE — ED Triage Notes (Signed)
Pt arrives via EMS from the store where she reports sudden sharp left sided chest pain.

## 2020-07-14 NOTE — ED Notes (Signed)
Patient verbalizes understanding of discharge instructions. Opportunity for questioning and answers were provided. Armband removed by staff, pt discharged from ED ambulatory to home.  

## 2021-07-20 IMAGING — CT CT ANGIO CHEST
2 of 6 series · 19 of 36 positions shown · IV contrast (omnipaque)
Comparison: Chest x-ray from earlier in the same day.

CLINICAL DATA: Left-sided chest pain

EXAM:
CT ANGIOGRAPHY CHEST WITH CONTRAST
TECHNIQUE: Multidetector CT imaging of the chest was performed using the
standard protocol during bolus administration of intravenous
contrast. Multiplanar CT image reconstructions and MIPs were
obtained to evaluate the vascular anatomy.
CONTRAST:  65mL OMNIPAQUE IOHEXOL 350 MG/ML SOLN

[Series 7: pe thins · axial · 0.58mm/px · z∈[+971,+1194]mm · 18 of 355 slices shown]
[im 18/355  lung]
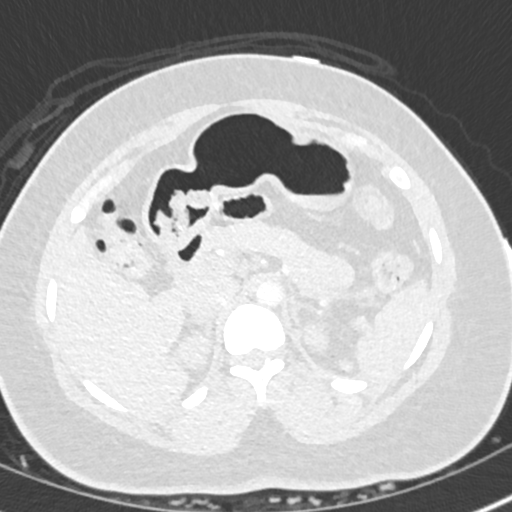
[im 36/355  mediastinal]
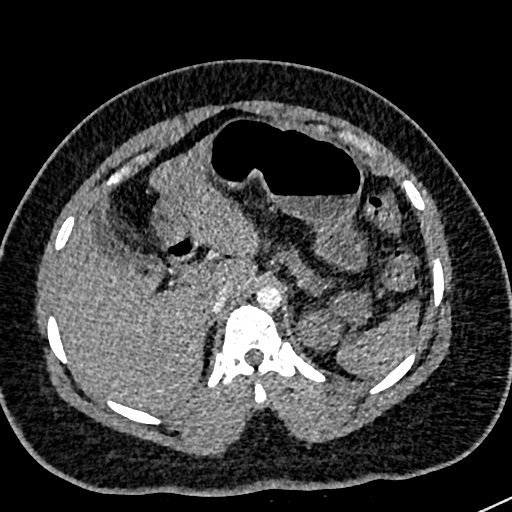
[im 54/355  lung]
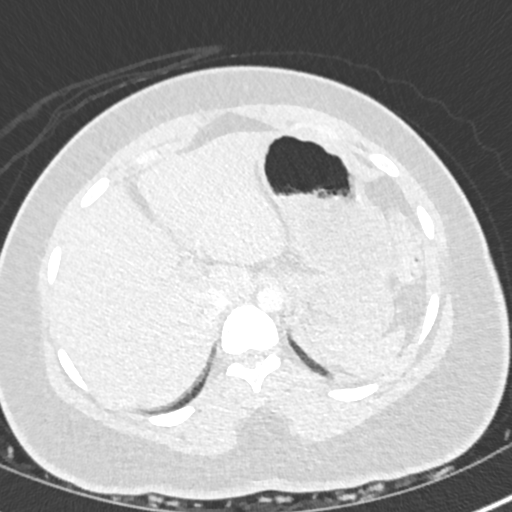
[im 71/355  mediastinal]
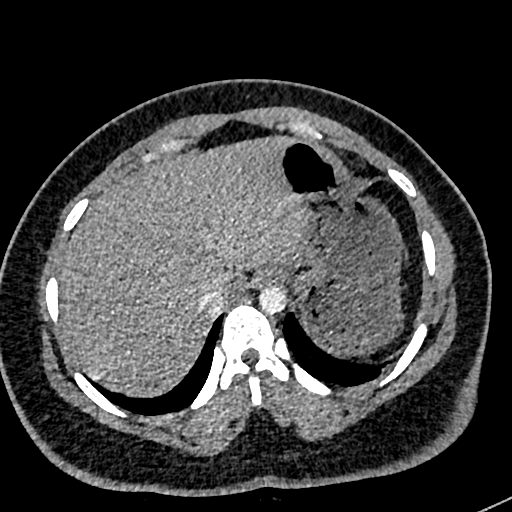
[im 89/355  lung]
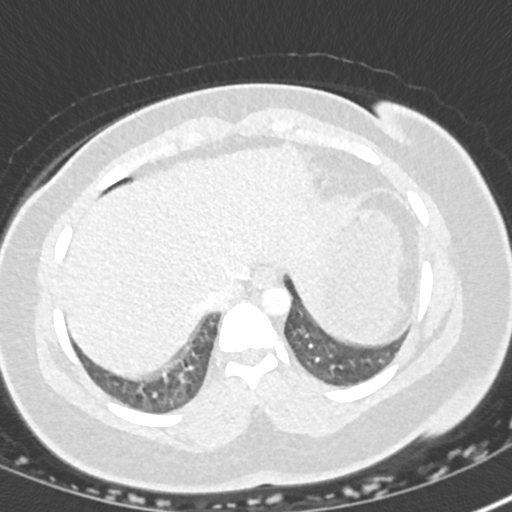
[im 107/355  mediastinal]
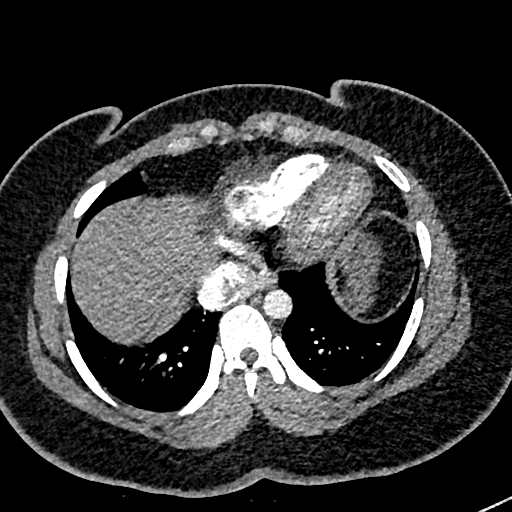
[im 124/355  lung]
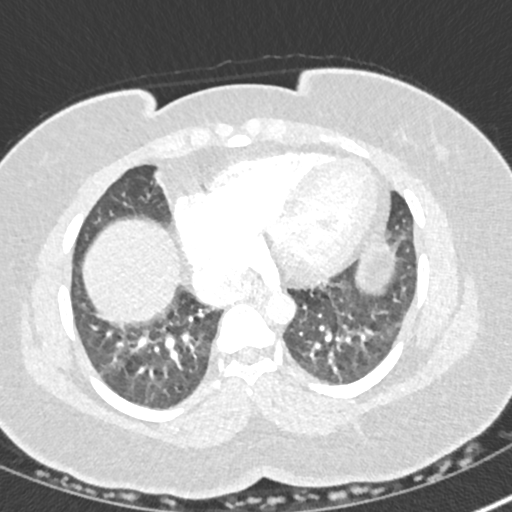
[im 142/355  mediastinal]
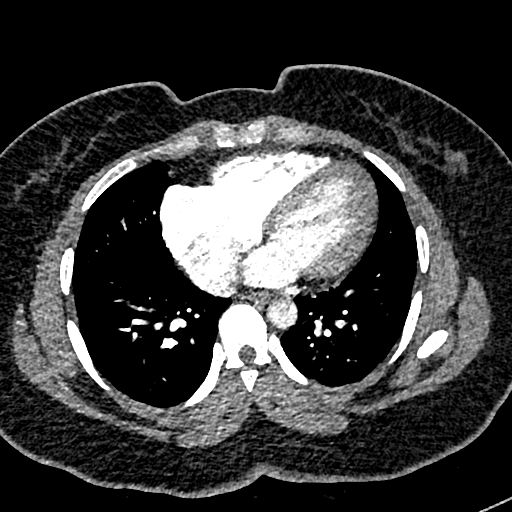
[im 160/355  lung]
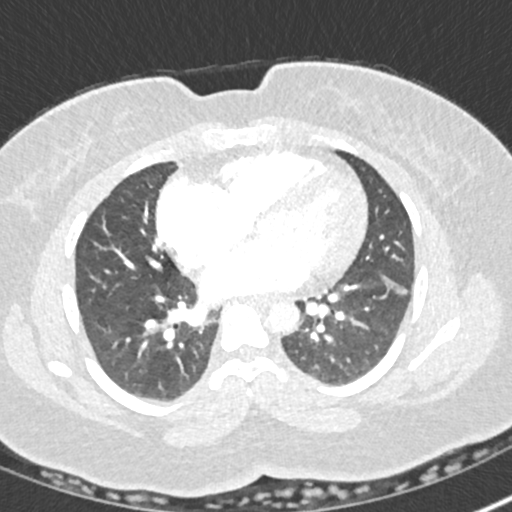
[im 195/355  mediastinal]
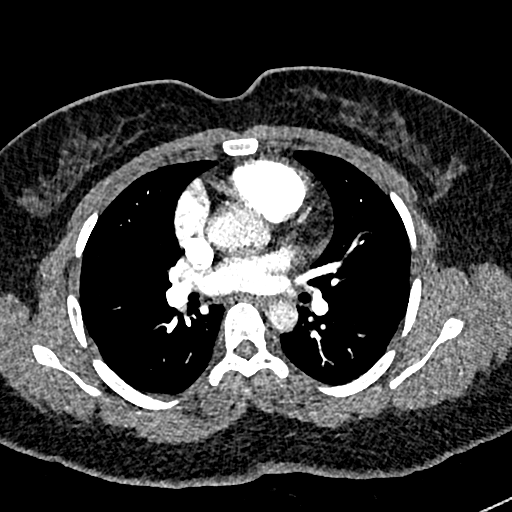
[im 213/355  lung]
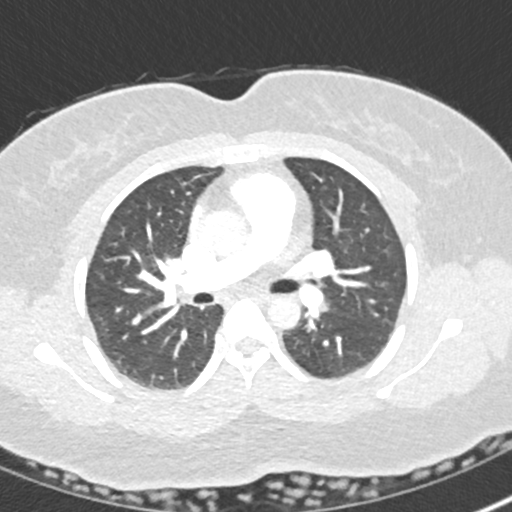
[im 231/355  mediastinal]
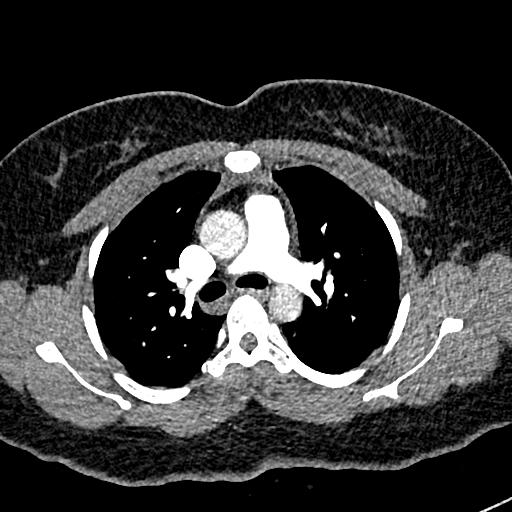
[im 248/355  lung]
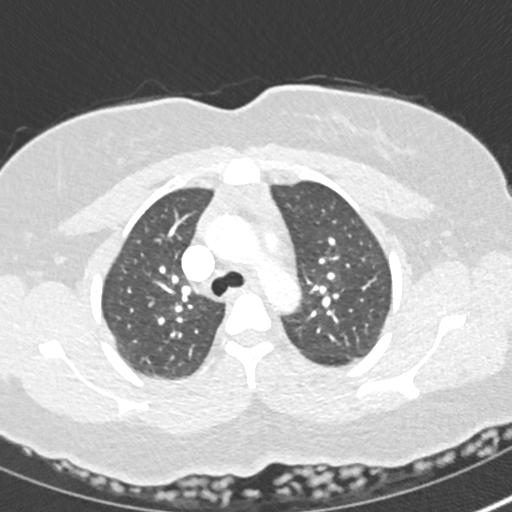
[im 266/355  mediastinal]
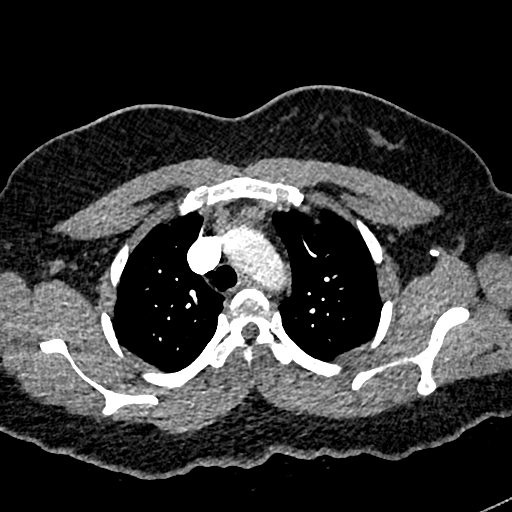
[im 284/355  lung]
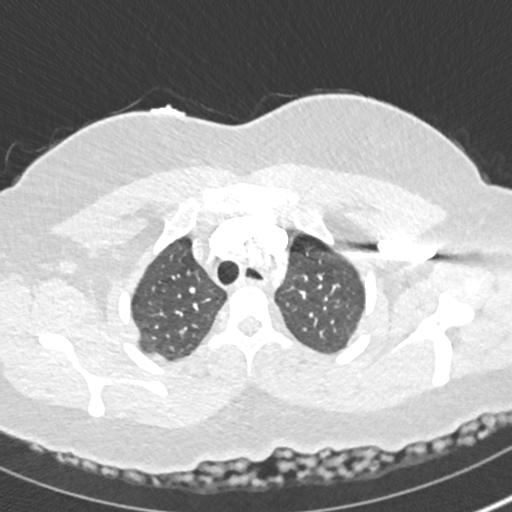
[im 301/355  mediastinal]
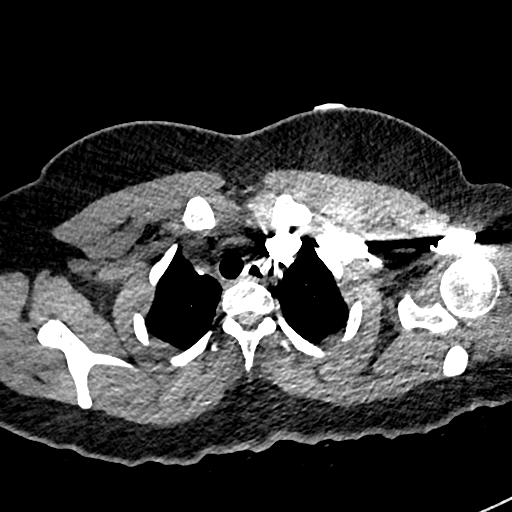
[im 319/355  lung]
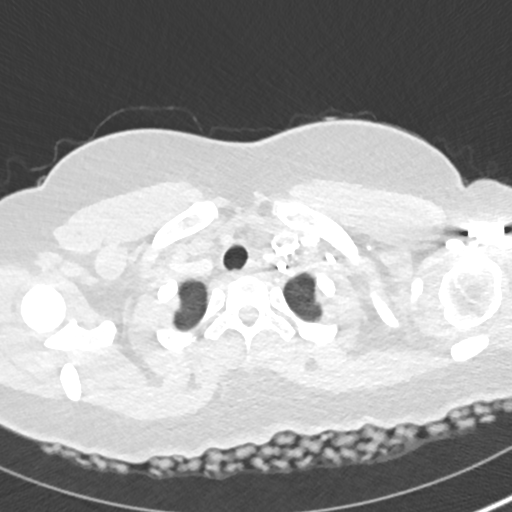
[im 337/355  mediastinal]
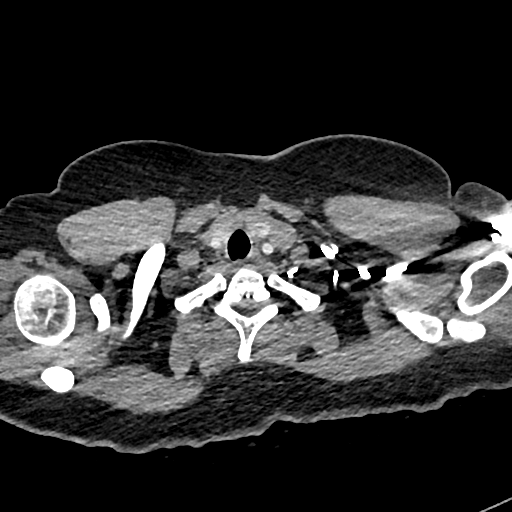

[Series 9: pe 2mm cor · coronal · 0.51mm/px · 1 of 130 slices shown]
[im 65/130  mediastinal]
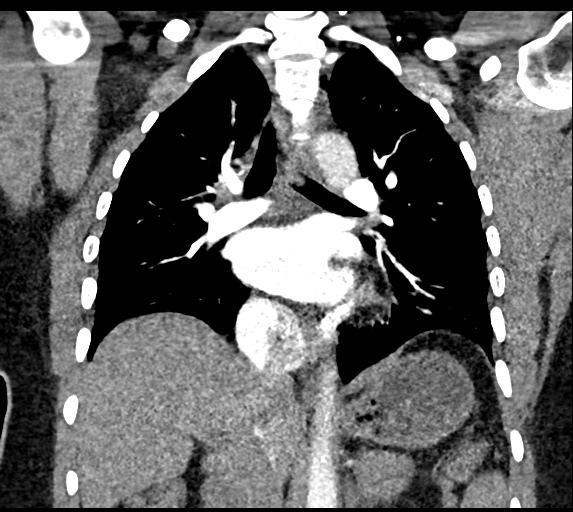

[19 of 36 positions shown; findings below may reference images not displayed]

FINDINGS: Cardiovascular: Thoracic aorta is faintly opacified although shows
no aneurysmal dilatation or findings of dissection. The heart is
mildly enlarged. The pulmonary artery shows a normal branching
pattern bilaterally. No definitive filling defect to suggest
pulmonary embolism is seen.

Mediastinum/Nodes: Thoracic inlet is within normal limits. No
definitive hilar or mediastinal adenopathy is noted. Some residual
thymus tissue is noted in the anterior mediastinum. The esophagus is
within normal limits.

Lungs/Pleura: Lungs are well aerated bilaterally. No focal
infiltrate or sizable effusion is seen. Minimal dependent
atelectatic changes are seen.

Upper Abdomen: Visualized upper abdomen shows the gallbladder to be
decompressed. No other focal abnormality is noted.

Musculoskeletal: No acute bony abnormality is noted.

Review of the MIP images confirms the above findings.
IMPRESSION: No evidence of pulmonary emboli.

No focal infiltrate or sizable effusion is noted.

## 2021-07-20 IMAGING — CR DG CHEST 2V
2 series · 2 of 2 positions shown · non-contrast
Comparison: January 24, 2018

CLINICAL DATA: Chest pain

EXAM:
CHEST - 2 VIEW

[chest lat]
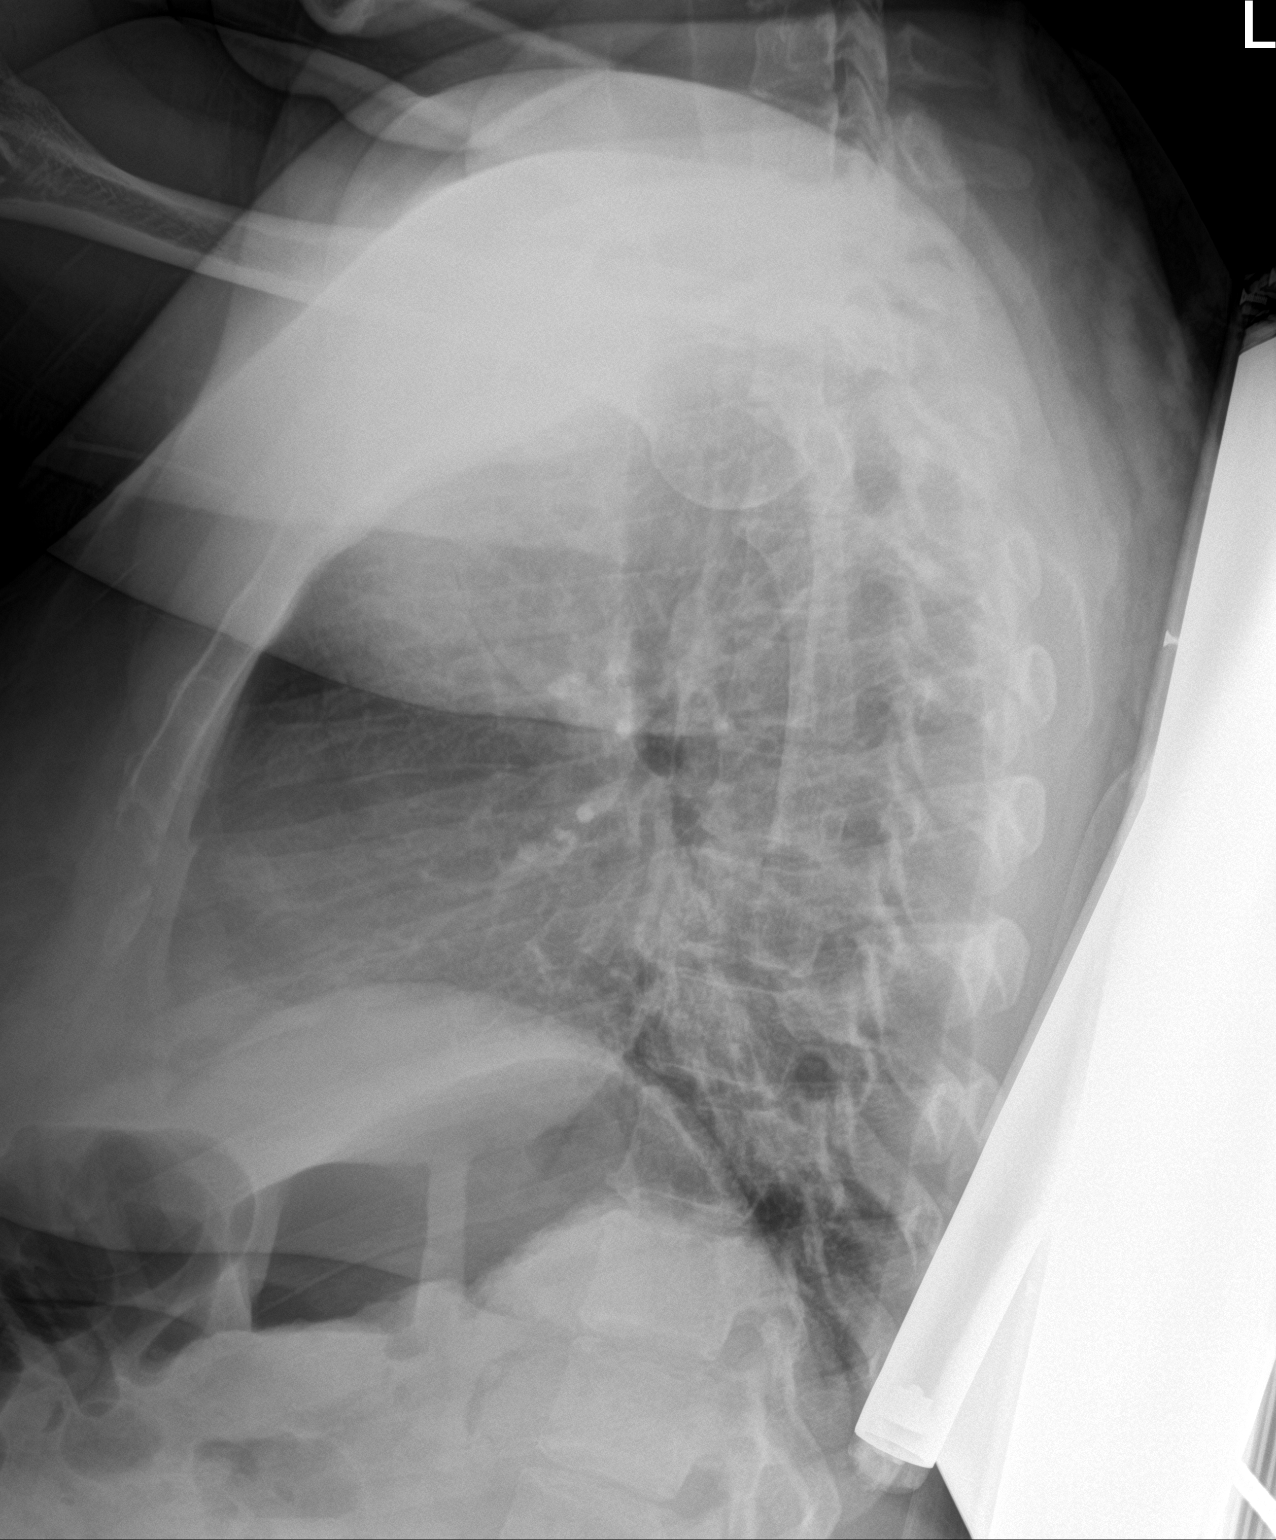

[chest ap]
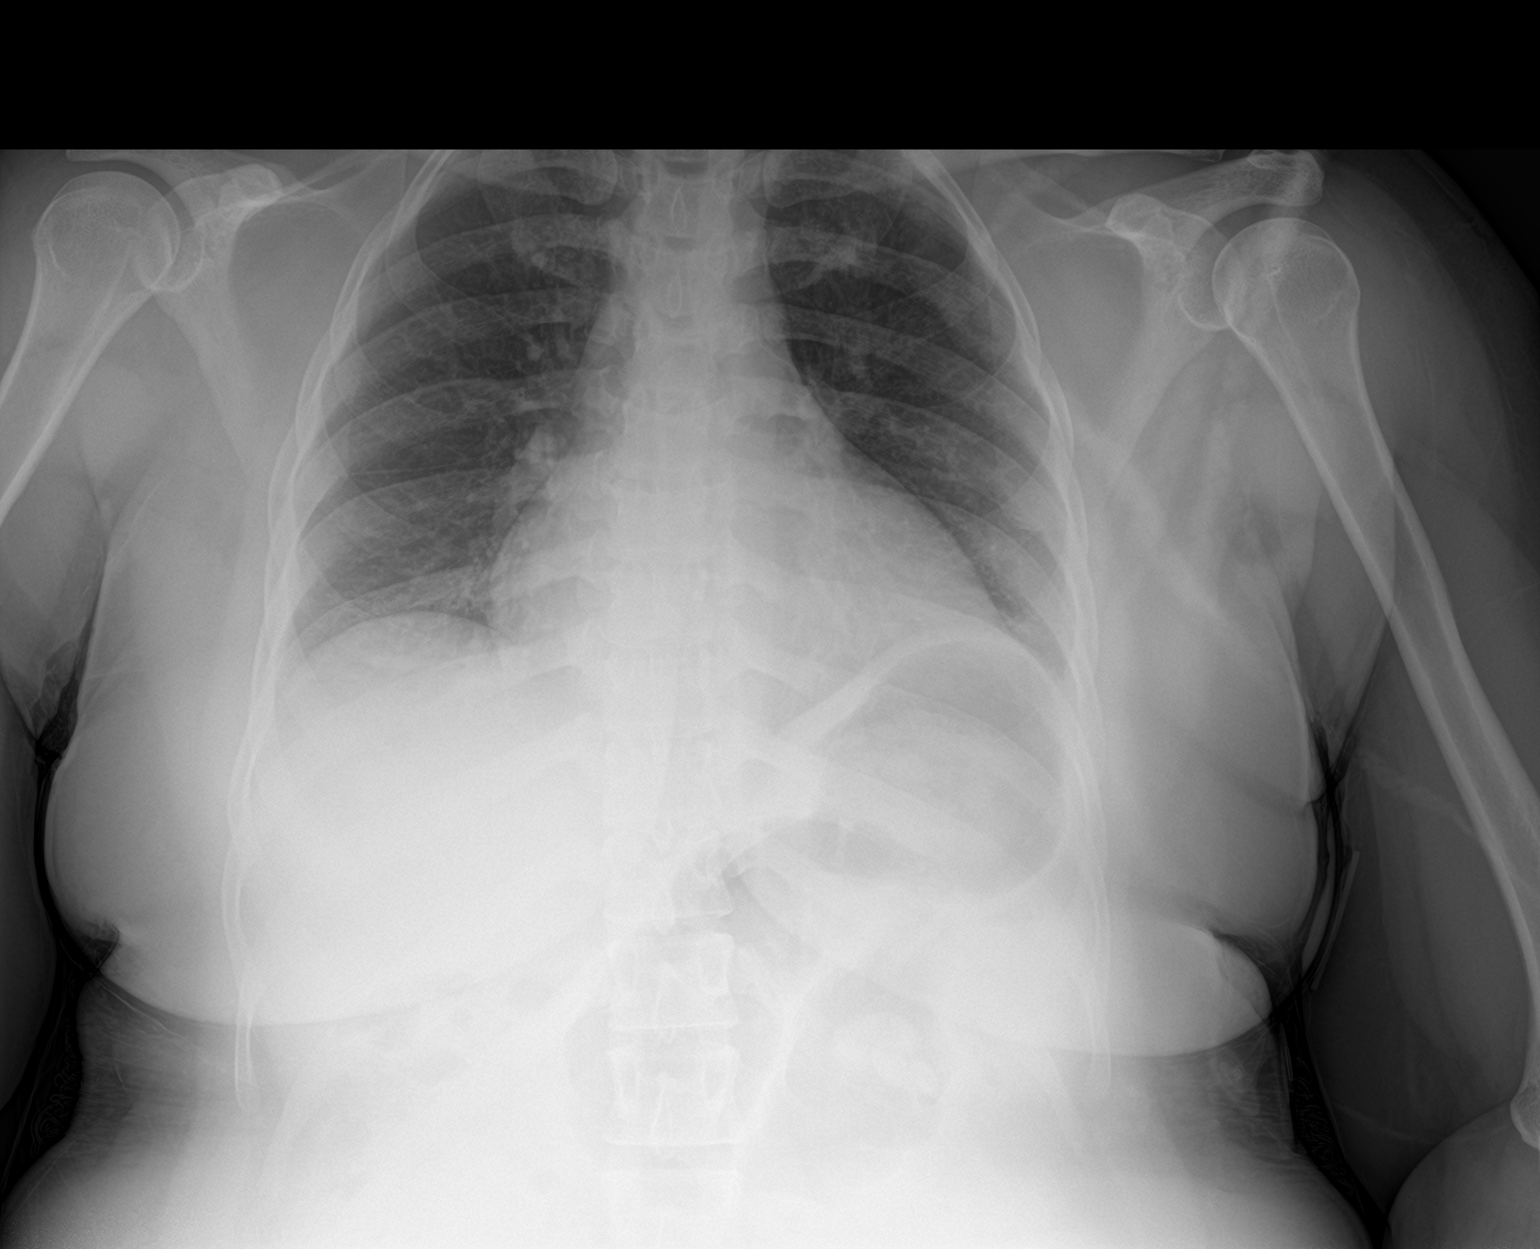

[2 of 2 positions shown; findings below may reference images not displayed]

FINDINGS: The heart size and mediastinal contours are within normal limits.
Both lungs are clear. The visualized skeletal structures are
unremarkable.
IMPRESSION: No active cardiopulmonary disease.
